# Patient Record
Sex: Female | Born: 1945
Health system: Southern US, Community
[De-identification: ages and names within clinical notes are randomized; demographics above are authoritative.]

## PROBLEM LIST (undated history)

## (undated) DIAGNOSIS — Z9889 Other specified postprocedural states: Secondary | ICD-10-CM

## (undated) DIAGNOSIS — R112 Nausea with vomiting, unspecified: Secondary | ICD-10-CM

## (undated) DIAGNOSIS — C801 Malignant (primary) neoplasm, unspecified: Secondary | ICD-10-CM

## (undated) HISTORY — PX: ABDOMINAL HYSTERECTOMY: SHX81

---

## 2007-05-17 ENCOUNTER — Ambulatory Visit (HOSPITAL_COMMUNITY): Admission: RE | Admit: 2007-05-17 | Discharge: 2007-05-17 | Payer: Self-pay | Admitting: Ophthalmology

## 2007-06-07 ENCOUNTER — Ambulatory Visit (HOSPITAL_COMMUNITY): Admission: RE | Admit: 2007-06-07 | Discharge: 2007-06-07 | Payer: Self-pay | Admitting: Ophthalmology

## 2012-01-19 ENCOUNTER — Emergency Department (HOSPITAL_COMMUNITY): Payer: Medicare Other

## 2012-01-19 ENCOUNTER — Observation Stay (HOSPITAL_COMMUNITY): Payer: Medicare Other | Admitting: Anesthesiology

## 2012-01-19 ENCOUNTER — Inpatient Hospital Stay (HOSPITAL_COMMUNITY)
Admission: EM | Admit: 2012-01-19 | Discharge: 2012-01-23 | DRG: 340 | Disposition: A | Discharge status: Home / Self Care | Payer: Medicare Other | Attending: General Surgery | Admitting: General Surgery

## 2012-01-19 ENCOUNTER — Encounter (HOSPITAL_COMMUNITY): Payer: Self-pay | Admitting: Emergency Medicine

## 2012-01-19 ENCOUNTER — Encounter (HOSPITAL_COMMUNITY): Payer: Self-pay | Admitting: Anesthesiology

## 2012-01-19 ENCOUNTER — Encounter (HOSPITAL_COMMUNITY)
Admission: EM | Disposition: A | Discharge status: Home / Self Care | Payer: Self-pay | Source: Home / Self Care | Attending: General Surgery

## 2012-01-19 DIAGNOSIS — K3580 Unspecified acute appendicitis: Secondary | ICD-10-CM

## 2012-01-19 DIAGNOSIS — K35209 Acute appendicitis with generalized peritonitis, without abscess, unspecified as to perforation: Principal | ICD-10-CM | POA: Diagnosis present

## 2012-01-19 DIAGNOSIS — F17200 Nicotine dependence, unspecified, uncomplicated: Secondary | ICD-10-CM | POA: Diagnosis present

## 2012-01-19 DIAGNOSIS — K352 Acute appendicitis with generalized peritonitis, without abscess: Principal | ICD-10-CM | POA: Diagnosis present

## 2012-01-19 HISTORY — DX: Nausea with vomiting, unspecified: R11.2

## 2012-01-19 HISTORY — PX: LAPAROSCOPIC APPENDECTOMY: SHX408

## 2012-01-19 HISTORY — DX: Other specified postprocedural states: Z98.890

## 2012-01-19 HISTORY — DX: Malignant (primary) neoplasm, unspecified: C80.1

## 2012-01-19 LAB — CBC WITH DIFFERENTIAL/PLATELET
Basophils Absolute: 0 10*3/uL (ref 0.0–0.1)
Basophils Relative: 0 % (ref 0–1)
Eosinophils Absolute: 0 10*3/uL (ref 0.0–0.7)
Hemoglobin: 15.1 g/dL — ABNORMAL HIGH (ref 12.0–15.0)
MCH: 31.7 pg (ref 26.0–34.0)
MCHC: 34.6 g/dL (ref 30.0–36.0)
Monocytes Relative: 3 % (ref 3–12)
Neutro Abs: 15 10*3/uL — ABNORMAL HIGH (ref 1.7–7.7)
Neutrophils Relative %: 91 % — ABNORMAL HIGH (ref 43–77)
RDW: 13.4 % (ref 11.5–15.5)

## 2012-01-19 LAB — HEPATIC FUNCTION PANEL
Alkaline Phosphatase: 79 U/L (ref 39–117)
Bilirubin, Direct: 0.2 mg/dL (ref 0.0–0.3)
Indirect Bilirubin: 0.6 mg/dL (ref 0.3–0.9)
Total Bilirubin: 0.8 mg/dL (ref 0.3–1.2)

## 2012-01-19 LAB — URINALYSIS, ROUTINE W REFLEX MICROSCOPIC
Bilirubin Urine: NEGATIVE
Ketones, ur: 15 mg/dL — AB
Leukocytes, UA: NEGATIVE
Nitrite: NEGATIVE
Urobilinogen, UA: 0.2 mg/dL (ref 0.0–1.0)

## 2012-01-19 LAB — URINE MICROSCOPIC-ADD ON

## 2012-01-19 LAB — BASIC METABOLIC PANEL
BUN: 20 mg/dL (ref 6–23)
Creatinine, Ser: 0.7 mg/dL (ref 0.50–1.10)
GFR calc Af Amer: >90 mL/min (ref >90)
GFR calc non Af Amer: 87 mL/min — ABNORMAL LOW (ref >90)
Potassium: 3.6 mEq/L (ref 3.5–5.1)

## 2012-01-19 SURGERY — APPENDECTOMY, LAPAROSCOPIC
Anesthesia: General | Wound class: Dirty or Infected

## 2012-01-19 MED ORDER — ROCURONIUM BROMIDE 100 MG/10ML IV SOLN
INTRAVENOUS | Status: DC | PRN
  Administered 2012-01-19: 30 mg via INTRAVENOUS

## 2012-01-19 MED ORDER — ENOXAPARIN SODIUM 40 MG/0.4ML ~~LOC~~ SOLN
40.0000 mg | SUBCUTANEOUS | Status: DC
  Administered 2012-01-19 – 2012-01-22 (×4): 40 mg via SUBCUTANEOUS
  Filled 2012-01-19 (×4): qty 0.4

## 2012-01-19 MED ORDER — SODIUM CHLORIDE 0.9 % IV SOLN
INTRAVENOUS | Status: DC
  Administered 2012-01-19 (×2): via INTRAVENOUS

## 2012-01-19 MED ORDER — MIDAZOLAM HCL 2 MG/2ML IJ SOLN
INTRAMUSCULAR | Status: AC
  Filled 2012-01-19: qty 2

## 2012-01-19 MED ORDER — LIDOCAINE HCL (PF) 1 % IJ SOLN
INTRAMUSCULAR | Status: AC
  Filled 2012-01-19: qty 5

## 2012-01-19 MED ORDER — SODIUM CHLORIDE 0.9 % IV SOLN
1.0000 g | INTRAVENOUS | Status: DC
  Administered 2012-01-20 – 2012-01-22 (×3): 1 g via INTRAVENOUS
  Filled 2012-01-19 (×4): qty 1

## 2012-01-19 MED ORDER — IOHEXOL 300 MG/ML  SOLN
100.0000 mL | Freq: Once | INTRAMUSCULAR | Status: AC | PRN
  Administered 2012-01-19: 100 mL via INTRAVENOUS

## 2012-01-19 MED ORDER — GLYCOPYRROLATE 0.2 MG/ML IJ SOLN
INTRAMUSCULAR | Status: AC
  Filled 2012-01-19: qty 2

## 2012-01-19 MED ORDER — PROPOFOL 10 MG/ML IV BOLUS
INTRAVENOUS | Status: DC | PRN
  Administered 2012-01-19: 130 mg via INTRAVENOUS

## 2012-01-19 MED ORDER — ONDANSETRON HCL 4 MG/2ML IJ SOLN
4.0000 mg | INTRAMUSCULAR | Status: DC | PRN
  Administered 2012-01-19: 4 mg via INTRAVENOUS
  Filled 2012-01-19: qty 2

## 2012-01-19 MED ORDER — LIDOCAINE HCL (CARDIAC) 10 MG/ML IV SOLN
INTRAVENOUS | Status: DC | PRN
  Administered 2012-01-19 (×2): 20 mg via INTRAVENOUS

## 2012-01-19 MED ORDER — SODIUM CHLORIDE 0.9 % IV SOLN
1.0000 g | Freq: Once | INTRAVENOUS | Status: AC
  Administered 2012-01-19: 1 g via INTRAVENOUS
  Filled 2012-01-19: qty 1

## 2012-01-19 MED ORDER — LACTATED RINGERS IV SOLN
INTRAVENOUS | Status: DC | PRN
  Administered 2012-01-19 (×2): via INTRAVENOUS

## 2012-01-19 MED ORDER — ROCURONIUM BROMIDE 50 MG/5ML IV SOLN
INTRAVENOUS | Status: AC
  Filled 2012-01-19: qty 1

## 2012-01-19 MED ORDER — ONDANSETRON HCL 4 MG/2ML IJ SOLN
INTRAMUSCULAR | Status: AC
  Administered 2012-01-19: 4 mg
  Filled 2012-01-19: qty 2

## 2012-01-19 MED ORDER — PROPOFOL 10 MG/ML IV EMUL
INTRAVENOUS | Status: AC
  Filled 2012-01-19: qty 20

## 2012-01-19 MED ORDER — FENTANYL CITRATE 0.05 MG/ML IJ SOLN
INTRAMUSCULAR | Status: AC
  Administered 2012-01-19: 50 ug
  Filled 2012-01-19: qty 2

## 2012-01-19 MED ORDER — MIDAZOLAM HCL 5 MG/5ML IJ SOLN
INTRAMUSCULAR | Status: DC | PRN
  Administered 2012-01-19: 2 mg via INTRAVENOUS

## 2012-01-19 MED ORDER — BUPIVACAINE HCL (PF) 0.5 % IJ SOLN
INTRAMUSCULAR | Status: DC | PRN
  Administered 2012-01-19: 10 mL

## 2012-01-19 MED ORDER — ONDANSETRON HCL 4 MG/2ML IJ SOLN
4.0000 mg | Freq: Four times a day (QID) | INTRAMUSCULAR | Status: DC | PRN
  Administered 2012-01-19 – 2012-01-20 (×2): 4 mg via INTRAVENOUS
  Filled 2012-01-19 (×2): qty 2

## 2012-01-19 MED ORDER — FENTANYL CITRATE 0.05 MG/ML IJ SOLN
INTRAMUSCULAR | Status: DC | PRN
  Administered 2012-01-19 (×3): 50 ug via INTRAVENOUS

## 2012-01-19 MED ORDER — FENTANYL CITRATE 0.05 MG/ML IJ SOLN
50.0000 ug | INTRAMUSCULAR | Status: DC | PRN
  Administered 2012-01-19: 50 ug via INTRAVENOUS
  Filled 2012-01-19: qty 2

## 2012-01-19 MED ORDER — FENTANYL CITRATE 0.05 MG/ML IJ SOLN
INTRAMUSCULAR | Status: AC
  Filled 2012-01-19: qty 5

## 2012-01-19 MED ORDER — SODIUM CHLORIDE 0.9 % IR SOLN
Status: DC | PRN
  Administered 2012-01-19: 1000 mL

## 2012-01-19 MED ORDER — ENOXAPARIN SODIUM 40 MG/0.4ML ~~LOC~~ SOLN
40.0000 mg | Freq: Once | SUBCUTANEOUS | Status: AC
  Administered 2012-01-19: 40 mg via SUBCUTANEOUS

## 2012-01-19 MED ORDER — PANTOPRAZOLE SODIUM 40 MG IV SOLR
40.0000 mg | Freq: Every day | INTRAVENOUS | Status: DC
  Administered 2012-01-19 – 2012-01-22 (×4): 40 mg via INTRAVENOUS
  Filled 2012-01-19 (×4): qty 40

## 2012-01-19 MED ORDER — SODIUM CHLORIDE 0.9 % IR SOLN
Status: DC | PRN
  Administered 2012-01-19: 3000 mL

## 2012-01-19 MED ORDER — HYDROCODONE-ACETAMINOPHEN 5-325 MG PO TABS: 1.0000 | ORAL_TABLET | ORAL | Status: DC | PRN

## 2012-01-19 SURGICAL SUPPLY — 47 items
BAG HAMPER (MISCELLANEOUS) ×2 IMPLANT
BENZOIN TINCTURE PRP APPL 2/3 (GAUZE/BANDAGES/DRESSINGS) IMPLANT
CLOTH BEACON ORANGE TIMEOUT ST (SAFETY) ×2 IMPLANT
COVER LIGHT HANDLE STERIS (MISCELLANEOUS) ×4 IMPLANT
CUTTER ENDO LINEAR 45M (STAPLE) ×2 IMPLANT
DECANTER SPIKE VIAL GLASS SM (MISCELLANEOUS) ×2 IMPLANT
DEVICE TROCAR PUNCTURE CLOSURE (ENDOMECHANICALS) ×2 IMPLANT
DURAPREP 26ML APPLICATOR (WOUND CARE) ×2 IMPLANT
ELECT REM PT RETURN 9FT ADLT (ELECTROSURGICAL) ×2
ELECTRODE REM PT RTRN 9FT ADLT (ELECTROSURGICAL) ×1 IMPLANT
FILTER SMOKE EVAC LAPAROSHD (FILTER) ×2 IMPLANT
FORMALIN 10 PREFIL 120ML (MISCELLANEOUS) ×2 IMPLANT
GLOVE BIOGEL PI IND STRL 7.0 (GLOVE) ×2 IMPLANT
GLOVE BIOGEL PI IND STRL 7.5 (GLOVE) ×1 IMPLANT
GLOVE BIOGEL PI INDICATOR 7.0 (GLOVE) ×2
GLOVE BIOGEL PI INDICATOR 7.5 (GLOVE) ×1
GLOVE ECLIPSE 7.0 STRL STRAW (GLOVE) ×2 IMPLANT
GLOVE EXAM NITRILE MD LF STRL (GLOVE) ×2 IMPLANT
GLOVE SS BIOGEL STRL SZ 6.5 (GLOVE) ×1 IMPLANT
GLOVE SUPERSENSE BIOGEL SZ 6.5 (GLOVE) ×1
GOWN STRL REIN XL XLG (GOWN DISPOSABLE) ×4 IMPLANT
INST SET LAPROSCOPIC AP (KITS) ×2 IMPLANT
IV NS IRRIG 3000ML ARTHROMATIC (IV SOLUTION) ×2 IMPLANT
KIT ROOM TURNOVER APOR (KITS) ×2 IMPLANT
MANIFOLD NEPTUNE II (INSTRUMENTS) ×2 IMPLANT
NEEDLE INSUFFLATION 14GA 120MM (NEEDLE) ×2 IMPLANT
NS IRRIG 1000ML POUR BTL (IV SOLUTION) ×2 IMPLANT
PACK LAP CHOLE LZT030E (CUSTOM PROCEDURE TRAY) ×2 IMPLANT
PAD ARMBOARD 7.5X6 YLW CONV (MISCELLANEOUS) ×2 IMPLANT
POUCH SPECIMEN RETRIEVAL 10MM (ENDOMECHANICALS) ×2 IMPLANT
RELOAD 45 VASCULAR/THIN (ENDOMECHANICALS) IMPLANT
RELOAD STAPLE TA45 3.5 REG BLU (ENDOMECHANICALS) ×2 IMPLANT
SEALER TISSUE G2 CVD JAW 35 (ENDOMECHANICALS) ×1 IMPLANT
SEALER TISSUE G2 CVD JAW 45CM (ENDOMECHANICALS) ×1
SET BASIN LINEN APH (SET/KITS/TRAYS/PACK) ×2 IMPLANT
SET TUBE IRRIG SUCTION NO TIP (IRRIGATION / IRRIGATOR) ×2 IMPLANT
SPONGE GAUZE 2X2 8PLY STRL LF (GAUZE/BANDAGES/DRESSINGS) ×6 IMPLANT
STAPLER VISISTAT (STAPLE) ×2 IMPLANT
STRIP CLOSURE SKIN 1/2X4 (GAUZE/BANDAGES/DRESSINGS) IMPLANT
SUT MNCRL AB 4-0 PS2 18 (SUTURE) ×2 IMPLANT
SUT VIC AB 2-0 CT2 27 (SUTURE) ×4 IMPLANT
TAPE CLOTH SURG 4X10 WHT LF (GAUZE/BANDAGES/DRESSINGS) ×2 IMPLANT
TRAY FOLEY CATH 14FR (SET/KITS/TRAYS/PACK) ×2 IMPLANT
TROCAR Z-THAD FIOS HNDL 12X100 (TROCAR) ×2 IMPLANT
TROCAR Z-THRD FIOS HNDL 11X100 (TROCAR) ×2 IMPLANT
TROCAR Z-THREAD FIOS 5X100MM (TROCAR) ×2 IMPLANT
WARMER LAPAROSCOPE (MISCELLANEOUS) ×2 IMPLANT

## 2012-01-19 NOTE — Addendum Note (Signed)
Addendum  created 01/19/12 2008 by Franco Nones, CRNA   Modules edited:Anesthesia Events, Anesthesia Medication Administration

## 2012-01-19 NOTE — Anesthesia Procedure Notes (Signed)
Procedure Name: Intubation Date/Time: 01/19/2012 6:43 PM Performed by: Franco Nones Pre-anesthesia Checklist: Patient identified, Patient being monitored, Timeout performed, Emergency Drugs available and Suction available Patient Re-evaluated:Patient Re-evaluated prior to inductionOxygen Delivery Method: Circle System Utilized Preoxygenation: Pre-oxygenation with 100% oxygen Intubation Type: IV induction, Rapid sequence and Cricoid Pressure applied Ventilation: Mask ventilation without difficulty Laryngoscope Size: Mac and 3 Grade View: Grade III Tube type: Oral Tube size: 7.0 mm Number of attempts: 2 Airway Equipment and Method: stylet Placement Confirmation: ETT inserted through vocal cords under direct vision,  positive ETCO2 and breath sounds checked- equal and bilateral Secured at: 21 cm Tube secured with: Tape Dental Injury: Teeth and Oropharynx as per pre-operative assessment  Comments: Unable to visualize with Hyacinth Meeker 2  Grade 3 with mac 3

## 2012-01-19 NOTE — Transfer of Care (Signed)
Immediate Anesthesia Transfer of Care Note  Patient: Candice Landry  Procedure(s) Performed: Procedure(s) (LRB): APPENDECTOMY LAPAROSCOPIC (N/A)  Patient Location: PACU  Anesthesia Type: General  Level of Consciousness: awake  Airway & Oxygen Therapy: Patient Spontanous Breathing and non-rebreather face mask  Post-op Assessment: Report given to PACU RN, Post -op Vital signs reviewed and stable and Patient moving all extremities  Post vital signs: Reviewed and stable  Complications: No apparent anesthesia complications

## 2012-01-19 NOTE — H&P (Signed)
Candice Landry is an 67 y.o. female.   Chief Complaint: Abdominal pain HPI: Patient presented to [facility] with approximately 2-3 days of increasing right lower quadrant abdominal pain. No similar symptomatology in the past. Pain is been continuous. No significant radiation. Pain is worse with palpation and movement. Pain is also worse with bumps during her trip to the hospital. She has had associated nausea but no emesis. She has had associated subjective fevers and chills. No change in bowel movements. No melena or hematochezia. No sick contacts. No unusual exposures.  History reviewed. No pertinent past medical history.  Past Surgical History  Procedure Date  . Abdominal hysterectomy     No family history on file. Social History:  reports that she has been smoking Cigarettes.  She has been smoking about .5 packs per day. She does not have any smokeless tobacco history on file. She reports that she drinks alcohol. Her drug history not on file.  Allergies: No Known Allergies   (Not in a hospital admission)  Results for orders placed during the hospital encounter of 01/19/12 (from the past 48 hour(s))  CBC WITH DIFFERENTIAL     Status: Abnormal   Collection Time   01/19/12 11:14 AM      Component Value Range Comment   WBC 16.4 (*) 4.0 - 10.5 K/uL    RBC 4.76  3.87 - 5.11 MIL/uL    Hemoglobin 15.1 (*) 12.0 - 15.0 g/dL    HCT 40.9  81.1 - 91.4 %    MCV 91.8  78.0 - 100.0 fL    MCH 31.7  26.0 - 34.0 pg    MCHC 34.6  30.0 - 36.0 g/dL    RDW 78.2  95.6 - 21.3 %    Platelets 226  150 - 400 K/uL    Neutrophils Relative 91 (*) 43 - 77 %    Neutro Abs 15.0 (*) 1.7 - 7.7 K/uL    Lymphocytes Relative 6 (*) 12 - 46 %    Lymphs Abs 0.9  0.7 - 4.0 K/uL    Monocytes Relative 3  3 - 12 %    Monocytes Absolute 0.5  0.1 - 1.0 K/uL    Eosinophils Relative 0  0 - 5 %    Eosinophils Absolute 0.0  0.0 - 0.7 K/uL    Basophils Relative 0  0 - 1 %    Basophils Absolute 0.0  0.0 - 0.1 K/uL     BASIC METABOLIC PANEL     Status: Abnormal   Collection Time   01/19/12 11:14 AM      Component Value Range Comment   Sodium 134 (*) 135 - 145 mEq/L    Potassium 3.6  3.5 - 5.1 mEq/L    Chloride 98  96 - 112 mEq/L    CO2 26  19 - 32 mEq/L    Glucose, Bld 141 (*) 70 - 99 mg/dL    BUN 20  6 - 23 mg/dL    Creatinine, Ser 0.86  0.50 - 1.10 mg/dL    Calcium 9.8  8.4 - 57.8 mg/dL    GFR calc non Af Amer 87 (*) >90 mL/min    GFR calc Af Amer >90  >90 mL/min   LIPASE, BLOOD     Status: Normal   Collection Time   01/19/12 11:14 AM      Component Value Range Comment   Lipase 15  11 - 59 U/L   HEPATIC FUNCTION PANEL  Status: Normal   Collection Time   01/19/12 11:14 AM      Component Value Range Comment   Total Protein 7.7  6.0 - 8.3 g/dL    Albumin 4.0  3.5 - 5.2 g/dL    AST 12  0 - 37 U/L    ALT 18  0 - 35 U/L    Alkaline Phosphatase 79  39 - 117 U/L    Total Bilirubin 0.8  0.3 - 1.2 mg/dL    Bilirubin, Direct 0.2  0.0 - 0.3 mg/dL    Indirect Bilirubin 0.6  0.3 - 0.9 mg/dL   URINALYSIS, ROUTINE W REFLEX MICROSCOPIC     Status: Abnormal   Collection Time   01/19/12 11:50 AM      Component Value Range Comment   Color, Urine YELLOW  YELLOW    APPearance CLEAR  CLEAR    Specific Gravity, Urine >1.030 (*) 1.005 - 1.030    pH 5.5  5.0 - 8.0    Glucose, UA NEGATIVE  NEGATIVE mg/dL    Hgb urine dipstick SMALL (*) NEGATIVE    Bilirubin Urine NEGATIVE  NEGATIVE    Ketones, ur 15 (*) NEGATIVE mg/dL    Protein, ur TRACE (*) NEGATIVE mg/dL    Urobilinogen, UA 0.2  0.0 - 1.0 mg/dL    Nitrite NEGATIVE  NEGATIVE    Leukocytes, UA NEGATIVE  NEGATIVE   URINE MICROSCOPIC-ADD ON     Status: Abnormal   Collection Time   01/19/12 11:50 AM      Component Value Range Comment   Squamous Epithelial / LPF MANY (*) RARE    WBC, UA 7-10  <3 WBC/hpf    RBC / HPF 7-10  <3 RBC/hpf    Bacteria, UA MANY (*) RARE    Dg Chest 2 View  01/19/2012  *RADIOLOGY REPORT*  Clinical Data: Abdominal pain  CHEST - 2 VIEW   Comparison: None.  Findings: Lungs are hyperaerated. Bronchitic and interstitial changes have a chronic appearance.  Upper normal heart size.  Round density projects over the anterior right first rib.  Focal density projects over the anterior mid thoracic spine.  No pneumothorax. No pleural effusion.  IMPRESSION: There are densities projecting over the right first rib and thoracic spine which may represent bony findings.  Pulmonary nodules are not excluded.  Comparison with prior studies are recommended.  If none are available, CT can be performed to rule out nodule.   Original Report Authenticated By: Jolaine Click, M.D.    Ct Abdomen Pelvis W Contrast  01/19/2012  **ADDENDUM** CREATED: 01/19/2012 13:39:01  Addendum by Dr. Christiana Pellant on 01/19/2012 at 1:39 p.m. Terminal ileitis with secondary inflammation of the appendix, such as may be seen with Crohn's disease or other infection/inflammatory etiology, is possible but less likely.  **END ADDENDUM** SIGNED BY: Harrel Lemon, M.D.   01/19/2012  *RADIOLOGY REPORT*  Clinical Data: Abdominal pain.  Previous hysterectomy.  CT ABDOMEN AND PELVIS WITH CONTRAST  Technique:  Multidetector CT imaging of the abdomen and pelvis was performed following the standard protocol during bolus administration of intravenous contrast.  Contrast: OMNIPAQUE IOHEXOL 300 MG/ML  SOLN  Comparison: None  Findings: Minimal subpleural dependent scarring or atelectasis. Too small to characterize right hepatic lobe 6 mm hypodensity image 15, statistically most likely a cyst; other too small to characterize hepatic hypodense lesions are identified elsewhere. 1.7 cm right hepatic lobe cyst image 22.  4.8 cm right inferior renal pole cortical cyst.  Other too  small to characterize renal cortical hypodensities are identified elsewhere.  1.2 cm right upper renal pole cortical cyst image 21.  Small hiatal hernia.  The adrenal glands are diffusely prominent bilaterally without focal  measurable mass.  Gallbladder, pancreas, and spleen are unremarkable.  Mild moderate atheromatous aortic calcification without aneurysm.  No lymphadenopathy.  Within the pelvis, the appendix is dilated and fluid-filled, measuring maximal diameter 1.2 cm at its tip, with a proximal probable appendicolith image 65.  Surrounding pelvic stranding, pelvic free fluid with rim enhancement, and secondary terminal ileal small bowel wall thickening and mild prominence.  The colon is decompressed.  No acute osseous abnormality.  IMPRESSION: Dilated appendix with probable appendicolith and periappendiceal stranding and fluid, compatible with acute appendicitis.  Rim enhancing fluid collections within the pelvis may indicate early abscess formation, and can also be seen with appendiceal rupture.  Apparent secondary terminal ileal wall thickening and prominent, likely due to secondary inflammation.   Original Report Authenticated By: Christiana Pellant, M.D.     Review of Systems  Constitutional: Positive for fever, chills and diaphoresis. Negative for weight loss and malaise/fatigue.  HENT: Negative.   Eyes: Negative.   Respiratory: Negative.   Cardiovascular: Negative.   Gastrointestinal: Positive for nausea and abdominal pain (right lower quadrant). Negative for heartburn, vomiting, diarrhea, constipation, blood in stool and melena.  Genitourinary: Negative.   Musculoskeletal: Negative.   Skin: Negative.   Neurological: Negative.  Negative for weakness.  Endo/Heme/Allergies: Negative.   Psychiatric/Behavioral: Negative.     Blood pressure 122/64, pulse 90, temperature 99.1 F (37.3 C), resp. rate 18, height 5\' 4"  (1.626 m), weight 56.7 kg (125 lb), SpO2 95.00%. Physical Exam  Constitutional: She is oriented to person, place, and time. She appears well-developed and well-nourished. No distress.  HENT:  Head: Normocephalic and atraumatic.  Eyes: Conjunctivae normal and EOM are normal. Pupils are equal, round,  and reactive to light. No scleral icterus.  Neck: Normal range of motion. Neck supple. No tracheal deviation present. No thyromegaly present.  Cardiovascular: Normal rate, regular rhythm and normal heart sounds.   Respiratory: Effort normal and breath sounds normal. No respiratory distress. She has no wheezes.  GI: Soft. She exhibits no distension and no mass. There is tenderness (right lower quadrant pain at eburneous point. Positive Rovsing sign.). There is rebound and guarding.  Lymphadenopathy:    She has no cervical adenopathy.  Neurological: She is alert and oriented to person, place, and time.  Skin: Skin is warm and dry.     Assessment/Plan Acute appendicitis. I did discuss the CT findings with the patient. Clinically the patient truly does have acute appendicitis however based on the CT scan I am somewhat suspicious of an early perforation as well. Definitive findings cannot be confirmed until proceeding to the operating room and this was discussed with the patient. Risks benefits alternatives of laparoscopic, possible open appendectomy were discussed. At this point we'll continue n.p.o. status. Continue IV fluid hydration. Continue DVT prophylaxis. We'll proceed to the operator for planned laparoscopic possible open appendectomy.  Hibba Schram C 01/19/2012, 3:34 PM

## 2012-01-19 NOTE — Anesthesia Preprocedure Evaluation (Addendum)
Anesthesia Evaluation  Patient identified by MRN, date of birth, ID band Patient awake    Reviewed: Allergy & Precautions, H&P , NPO status , Patient's Chart, lab work & pertinent test results, reviewed documented beta blocker date and time   History of Anesthesia Complications (+) PONV  Airway Mallampati: II TM Distance: >3 FB Neck ROM: Full    Dental  (+) Teeth Intact   Pulmonary Current Smoker,    Pulmonary exam normal       Cardiovascular Exercise Tolerance: Good Rhythm:Regular Rate:Normal     Neuro/Psych    GI/Hepatic Patient received Oral Contrast Agents,  Endo/Other    Renal/GU      Musculoskeletal   Abdominal (+)  Abdomen: tender.    Peds  Hematology   Anesthesia Other Findings   Reproductive/Obstetrics                          Anesthesia Physical Anesthesia Plan  ASA: II and emergent  Anesthesia Plan: General   Post-op Pain Management:    Induction: Rapid sequence, Cricoid pressure planned and Intravenous  Airway Management Planned: Oral ETT  Additional Equipment:   Intra-op Plan:   Post-operative Plan: Extubation in OR  Informed Consent:   Dental advisory given  Plan Discussed with: Anesthesiologist  Anesthesia Plan Comments:         Anesthesia Quick Evaluation

## 2012-01-19 NOTE — Op Note (Signed)
Patient:  Candice Landry  DOB:  November 30, 1945  MRN:  [id number]   Preop Diagnosis:  Acute appendicitis  Postop Diagnosis:  Ruptured appendicitis  Procedure:  Laparoscopic appendectomy  Surgeon:  Dr. Tilford Pillar  Anes:  General endotracheal, 0.5% Sensorcaine plain for local  Indications:  Patient is a 67 year old female presented to [facility] with right lower quadrant abdominal pain. Workup and evaluation was consistent for acute appendicitis. There was some suspicion is periappendiceal fluid in the suspicion of a contained rupture. Risks benefits alternatives a laparoscopic possible open appendectomy were discussed at length the patient including but not limited to risk of bleeding, infection, bowel injury, appendiceal stump leak. Patient consented for the planned procedure.  Procedure note:  Patient was taken to the operating room was placed in supine position the or table time the general anesthetic is administered. Once patient was asleep she symmetrically admitted by the nurse anesthetist. At this point a Foley catheter is placed in standard sterile fashion by the operative staff. Her abdomen is prepped with DuraPrep solution. Drapes are placed in standard fashion. I mask performed. Stab incision was created supraumbilically with 11 blade scalpel with additional dissection down to subcuticular tissue carried out with a Coker clamp. A Veress needle is inserted saline drop test is utilized confirm intraperitoneal placement. Pneumoperitoneum was initiated and once sufficient pneumoperitoneum was obtained a 12 mm trocar was inserted over a laparoscope allowing visualization the trocar entering into the peritoneal cavity. At this point the inner cannulas removed lap scope was reinserted there is no evidence a trocar peritoneal placement injury. At this point a 5 mm placed in the suprapubic region, and a 11 mm or was placed into the left lateral abdominal wall. Patient's placed into a   Trendelenburg left lateral decubitus position.  The cecum was followed down to the base the appendix. There is. Once noted around the appendix. Suction irrigator is brought to the field and utilized to aspirate the purulent secretions.  A window was in created the base the appendix between the appendix and mesoappendix. The Enseal bipolar device is utilized to divide the mesoappendix. Hemostasis is excellent. The base of the appendix is then divided using a Endo GIA 45 standard stapler load. At this point the appendix is placed into an Endo Catch bag and placed into the right upper quadrant. The patient was returned back to a supine position. The skin is copiously irrigated with sterile saline until the returning aspirate was clear. Inspection of the staple line indicated excellent closure. There is no evidence of any bleeding from the mesoappendix. At this time I turned my attention to closure.  Using an Endo Close suture passing device a 2-0 Vicryl sutures passed both the umbilical and left lateral trocar sites. With the sutures in place the appendix was removed through the umbilical trocar site and intact Endo Catch bag was placed in the back table and sent as a permanent specimen to pathology. At this point the pneumoperitoneum was evacuated. Trochars were removed. The Vicryl sutures were secured. Local anesthetic is instilled. Skin staples are utilized reapproximate the skin edges. The skin was washed dried moist dry towel. Dressings are placed over the incisions. The drapes removed the dressings were secured. The patient's allowed to come out of general anesthetic is extubated and transferred to the PACU in stable condition. At the conclusion of procedure all instrument, sponge, needle counts are correct. Patient tolerated procedure extremely well.  Complications:  None apparent  EBL:  Minimal  Specimen:  Appendix

## 2012-01-19 NOTE — Anesthesia Postprocedure Evaluation (Signed)
Anesthesia Post Note  Patient: Candice Landry  Procedure(s) Performed: Procedure(s) (LRB): APPENDECTOMY LAPAROSCOPIC (N/A)  Anesthesia type: General  Patient location: PACU  Post pain: Pain level controlled  Post assessment: Post-op Vital signs reviewed, Patient's Cardiovascular Status Stable, Respiratory Function Stable, Patent Airway, No signs of Nausea or vomiting and Pain level controlled  Last Vitals:  Filed Vitals:   01/19/12 1941  BP: 128/63  Pulse: 96  Temp: 37.1 C  Resp: 24    Post vital signs: Reviewed and stable  Level of consciousness: awake and alert   Complications: No apparent anesthesia complications

## 2012-01-19 NOTE — ED Notes (Signed)
Pt states severe abd pain with some vomiting and hurts to move. Denies diarrhea. Last BM was this morning and was normal

## 2012-01-19 NOTE — ED Provider Notes (Signed)
History     CSN: [id number]  Arrival date & time 01/19/12  1014   First MD Initiated Contact with Patient 01/19/12 1059      Chief Complaint  Patient presents with  . Abdominal Pain  . Emesis     HPI Pt was seen at 1200.   Per pt, c/o gradual onset and worsening of constant generalized abd "pain" for the past 2 days.  Has been associated with several episodes of N/V.  States her pain is worse in her lower abd/pelvic area. Pain increases with movement of her torso, walking, and palpation of the area.  Denies diarrhea, no fevers, no back pain, no CP/SOB, no black or blood in stools or emesis, no dysuria, no vaginal bleeding/discharge.     History reviewed. No pertinent past medical history.  Past Surgical History  Procedure Date  . Abdominal hysterectomy     History  Substance Use Topics  . Smoking status: Current Every Day Smoker -- 0.5 packs/day    Types: Cigarettes  . Smokeless tobacco: Not on file  . Alcohol Use: Yes     Comment: occ    Review of Systems ROS: Statement: All systems negative except as marked or noted in the HPI; Constitutional: Negative for fever and chills. ; ; Eyes: Negative for eye pain, redness and discharge. ; ; ENMT: Negative for ear pain, hoarseness, nasal congestion, sinus pressure and sore throat. ; ; Cardiovascular: Negative for chest pain, palpitations, diaphoresis, dyspnea and peripheral edema. ; ; Respiratory: Negative for cough, wheezing and stridor. ; ; Gastrointestinal: +N/V, abd pain. Negative for diarrhea, blood in stool, hematemesis, jaundice and rectal bleeding. . ; ; Genitourinary: Negative for dysuria, flank pain and hematuria. ; ; Musculoskeletal: Negative for back pain and neck pain. Negative for swelling and trauma.; ; Skin: Negative for pruritus, rash, abrasions, blisters, bruising and skin lesion.; ; Neuro: Negative for headache, lightheadedness and neck stiffness. Negative for weakness, altered level of consciousness , altered mental  status, extremity weakness, paresthesias, involuntary movement, seizure and syncope.     Allergies  Review of patient's allergies indicates no known allergies.  Home Medications  No current outpatient prescriptions on file.  BP 122/64  Pulse 90  Temp 99.1 F (37.3 C)  Resp 18  Ht 5\' 4"  (1.626 m)  Wt 125 lb (56.7 kg)  BMI 21.46 kg/m2  SpO2 95%  Physical Exam 1205: Physical examination:  Nursing notes reviewed; Vital signs and O2 SAT reviewed;  Constitutional: Well developed, Well nourished, Well hydrated, Uncomfortable appearing; Head:  Normocephalic, atraumatic; Eyes: EOMI, PERRL, No scleral icterus; ENMT: Mouth and pharynx normal, Mucous membranes moist; Neck: Supple, Full range of motion, No lymphadenopathy; Cardiovascular: Regular rate and rhythm, No gallop; Respiratory: Breath sounds clear & equal bilaterally, No rales, rhonchi, wheezes.  Speaking full sentences with ease, Normal respiratory effort/excursion; Chest: Nontender, Movement normal; Abdomen: Soft, +diffusely tender, esp RLQ and suprapubic areas. Nondistended, Normal bowel sounds; Genitourinary: No CVA tenderness; Extremities: Pulses normal, No tenderness, No edema, No calf edema or asymmetry.; Neuro: AA&Ox3, Major CN grossly intact.  Speech clear. No gross focal motor or sensory deficits in extremities.; Skin: Color normal, Warm, Dry.    ED Course  Procedures     MDM  MDM Reviewed: nursing note and vitals Interpretation: labs, CT scan and x-ray     Results for orders placed during the hospital encounter of 01/19/12  CBC WITH DIFFERENTIAL      Component Value Range   WBC 16.4 (*) 4.0 -  10.5 K/uL   RBC 4.76  3.87 - 5.11 MIL/uL   Hemoglobin 15.1 (*) 12.0 - 15.0 g/dL   HCT 27.2  53.6 - 64.4 %   MCV 91.8  78.0 - 100.0 fL   MCH 31.7  26.0 - 34.0 pg   MCHC 34.6  30.0 - 36.0 g/dL   RDW 03.4  74.2 - 59.5 %   Platelets 226  150 - 400 K/uL   Neutrophils Relative 91 (*) 43 - 77 %   Neutro Abs 15.0 (*) 1.7 - 7.7  K/uL   Lymphocytes Relative 6 (*) 12 - 46 %   Lymphs Abs 0.9  0.7 - 4.0 K/uL   Monocytes Relative 3  3 - 12 %   Monocytes Absolute 0.5  0.1 - 1.0 K/uL   Eosinophils Relative 0  0 - 5 %   Eosinophils Absolute 0.0  0.0 - 0.7 K/uL   Basophils Relative 0  0 - 1 %   Basophils Absolute 0.0  0.0 - 0.1 K/uL  BASIC METABOLIC PANEL      Component Value Range   Sodium 134 (*) 135 - 145 mEq/L   Potassium 3.6  3.5 - 5.1 mEq/L   Chloride 98  96 - 112 mEq/L   CO2 26  19 - 32 mEq/L   Glucose, Bld 141 (*) 70 - 99 mg/dL   BUN 20  6 - 23 mg/dL   Creatinine, Ser 6.38  0.50 - 1.10 mg/dL   Calcium 9.8  8.4 - 75.6 mg/dL   GFR calc non Af Amer 87 (*) >90 mL/min   GFR calc Af Amer >90  >90 mL/min  URINALYSIS, ROUTINE W REFLEX MICROSCOPIC      Component Value Range   Color, Urine YELLOW  YELLOW   APPearance CLEAR  CLEAR   Specific Gravity, Urine >1.030 (*) 1.005 - 1.030   pH 5.5  5.0 - 8.0   Glucose, UA NEGATIVE  NEGATIVE mg/dL   Hgb urine dipstick SMALL (*) NEGATIVE   Bilirubin Urine NEGATIVE  NEGATIVE   Ketones, ur 15 (*) NEGATIVE mg/dL   Protein, ur TRACE (*) NEGATIVE mg/dL   Urobilinogen, UA 0.2  0.0 - 1.0 mg/dL   Nitrite NEGATIVE  NEGATIVE   Leukocytes, UA NEGATIVE  NEGATIVE  LIPASE, BLOOD      Component Value Range   Lipase 15  11 - 59 U/L  HEPATIC FUNCTION PANEL      Component Value Range   Total Protein 7.7  6.0 - 8.3 g/dL   Albumin 4.0  3.5 - 5.2 g/dL   AST 12  0 - 37 U/L   ALT 18  0 - 35 U/L   Alkaline Phosphatase 79  39 - 117 U/L   Total Bilirubin 0.8  0.3 - 1.2 mg/dL   Bilirubin, Direct 0.2  0.0 - 0.3 mg/dL   Indirect Bilirubin 0.6  0.3 - 0.9 mg/dL  URINE MICROSCOPIC-ADD ON      Component Value Range   Squamous Epithelial / LPF MANY (*) RARE   WBC, UA 7-10  <3 WBC/hpf   RBC / HPF 7-10  <3 RBC/hpf   Bacteria, UA MANY (*) RARE   Dg Chest 2 View 01/19/2012  *RADIOLOGY REPORT*  Clinical Data: Abdominal pain  CHEST - 2 VIEW  Comparison: None.  Findings: Lungs are hyperaerated.  Bronchitic and interstitial changes have a chronic appearance.  Upper normal heart size.  Round density projects over the anterior right first rib.  Focal density projects over  the anterior mid thoracic spine.  No pneumothorax. No pleural effusion.  IMPRESSION: There are densities projecting over the right first rib and thoracic spine which may represent bony findings.  Pulmonary nodules are not excluded.  Comparison with prior studies are recommended.  If none are available, CT can be performed to rule out nodule.   Original Report Authenticated By: Jolaine Click, M.D.    Ct Abdomen Pelvis W Contrast 01/19/2012  **ADDENDUM** CREATED: 01/19/2012 13:39:01  Addendum by Dr. Christiana Pellant on 01/19/2012 at 1:39 p.m. Terminal ileitis with secondary inflammation of the appendix, such as may be seen with Crohn's disease or other infection/inflammatory etiology, is possible but less likely.  **END ADDENDUM** SIGNED BY: Harrel Lemon, M.D.   01/19/2012  *RADIOLOGY REPORT*  Clinical Data: Abdominal pain.  Previous hysterectomy.  CT ABDOMEN AND PELVIS WITH CONTRAST  Technique:  Multidetector CT imaging of the abdomen and pelvis was performed following the standard protocol during bolus administration of intravenous contrast.  Contrast: OMNIPAQUE IOHEXOL 300 MG/ML  SOLN  Comparison: None  Findings: Minimal subpleural dependent scarring or atelectasis. Too small to characterize right hepatic lobe 6 mm hypodensity image 15, statistically most likely a cyst; other too small to characterize hepatic hypodense lesions are identified elsewhere. 1.7 cm right hepatic lobe cyst image 22.  4.8 cm right inferior renal pole cortical cyst.  Other too small to characterize renal cortical hypodensities are identified elsewhere.  1.2 cm right upper renal pole cortical cyst image 21.  Small hiatal hernia.  The adrenal glands are diffusely prominent bilaterally without focal measurable mass.  Gallbladder, pancreas, and spleen are  unremarkable.  Mild moderate atheromatous aortic calcification without aneurysm.  No lymphadenopathy.  Within the pelvis, the appendix is dilated and fluid-filled, measuring maximal diameter 1.2 cm at its tip, with a proximal probable appendicolith image 65.  Surrounding pelvic stranding, pelvic free fluid with rim enhancement, and secondary terminal ileal small bowel wall thickening and mild prominence.  The colon is decompressed.  No acute osseous abnormality.  IMPRESSION: Dilated appendix with probable appendicolith and periappendiceal stranding and fluid, compatible with acute appendicitis.  Rim enhancing fluid collections within the pelvis may indicate early abscess formation, and can also be seen with appendiceal rupture.  Apparent secondary terminal ileal wall thickening and prominent, likely due to secondary inflammation.   Original Report Authenticated By: Christiana Pellant, M.D.      1415:  +acute appendicitis on CT scan, will keep NPO and start abx.  Dx and testing d/w pt and family.  Questions answered.  Verb understanding, agreeable to admit.  T/C to General Surgeon Dr. Leticia Penna, case discussed, including:  HPI, pertinent PM/SHx, VS/PE, dx testing, ED course and treatment:  Agreeable to admit, he will come to ED for eval.         Laray Anger, DO 01/20/12 2006

## 2012-01-20 ENCOUNTER — Encounter (HOSPITAL_COMMUNITY): Payer: Self-pay | Admitting: *Deleted

## 2012-01-20 LAB — CBC
MCHC: 34.2 g/dL (ref 30.0–36.0)
MCV: 91.2 fL (ref 78.0–100.0)
Platelets: 197 10*3/uL (ref 150–400)
RDW: 13.5 % (ref 11.5–15.5)
WBC: 14.3 10*3/uL — ABNORMAL HIGH (ref 4.0–10.5)

## 2012-01-20 LAB — URINE CULTURE

## 2012-01-20 MED ORDER — PHENOL 1.4 % MT LIQD
1.0000 | OROMUCOSAL | Status: DC | PRN
  Administered 2012-01-20: 1 via OROMUCOSAL
  Filled 2012-01-20: qty 177

## 2012-01-20 MED ORDER — SODIUM CHLORIDE 0.9 % IJ SOLN
3.0000 mL | Freq: Two times a day (BID) | INTRAMUSCULAR | Status: DC
  Administered 2012-01-20 – 2012-01-22 (×5): 3 mL via INTRAVENOUS

## 2012-01-20 MED ORDER — SODIUM CHLORIDE 0.9 % IJ SOLN
3.0000 mL | INTRAMUSCULAR | Status: DC | PRN
  Administered 2012-01-20 – 2012-01-22 (×2): 3 mL via INTRAVENOUS

## 2012-01-20 NOTE — Addendum Note (Signed)
Addendum  created 01/20/12 1118 by Marolyn Hammock, CRNA   Modules edited:Notes Section

## 2012-01-20 NOTE — Anesthesia Postprocedure Evaluation (Signed)
  Anesthesia Post-op Note  Patient: Candice Landry  Procedure(s) Performed: Procedure(s) (LRB) with comments: APPENDECTOMY LAPAROSCOPIC (N/A)  Patient Location: PACU  Anesthesia Type: General  Level of Consciousness: awake, alert , oriented and patient cooperative  Airway and Oxygen Therapy: Patient Spontanous Breathing   Post-op Pain: mild  Post-op Assessment: Post-op Vital signs reviewed, Patient's Cardiovascular Status Stable, Respiratory Function Stable, Patent Airway and No signs of Nausea or vomiting  Post-op Vital Signs: Reviewed and stable  Complications: No apparent anesthesia complications

## 2012-01-20 NOTE — Progress Notes (Signed)
UR chart review completed.  

## 2012-01-20 NOTE — Progress Notes (Signed)
1 Day Post-Op  Subjective: No fevers or chills. Sore especially on the left side of her abdomen otherwise no significant complaints. No nausea or vomiting.  Objective: Vital signs in last 24 hours: Temp:  [97.5 F (36.4 C)-99.1 F (37.3 C)] 98.1 F (36.7 C) (01/10 1027) Pulse Rate:  [85-112] 85  (01/10 1027) Resp:  [17-24] 20  (01/10 1027) BP: (98-128)/(57-68) 98/58 mmHg (01/10 1027) SpO2:  [94 %-100 %] 98 % (01/10 1027) Weight:  [65.6 kg (144 lb 10 oz)] 65.6 kg (144 lb 10 oz) (01/09 2032) Last BM Date: 01/19/12  Intake/Output from previous day: 01/09 0701 - 01/10 0700 In: 3142.5 [P.O.:420; I.V.:2712.5; IV Piggyback:10] Out: 1060 [Urine:1050; Blood:10] Intake/Output this shift: Total I/O In: 300 [P.O.:300] Out: -   General appearance: alert and no distress GI: Soft, expected postoperative tenderness. Incisions are clean dry and intact. No peritoneal signs.  Lab Results:   Basename 01/20/12 0551 01/19/12 1114  WBC 14.3* 16.4*  HGB 12.7 15.1*  HCT 37.1 43.7  PLT 197 226   BMET  Basename 01/19/12 1114  NA 134*  K 3.6  CL 98  CO2 26  GLUCOSE 141*  BUN 20  CREATININE 0.70  CALCIUM 9.8   PT/INR No results found for this basename: LABPROT:2,INR:2 in the last 72 hours ABG No results found for this basename: PHART:2,PCO2:2,PO2:2,HCO3:2 in the last 72 hours  Studies/Results: Dg Chest 2 View  01/19/2012  *RADIOLOGY REPORT*  Clinical Data: Abdominal pain  CHEST - 2 VIEW  Comparison: None.  Findings: Lungs are hyperaerated. Bronchitic and interstitial changes have a chronic appearance.  Upper normal heart size.  Round density projects over the anterior right first rib.  Focal density projects over the anterior mid thoracic spine.  No pneumothorax. No pleural effusion.  IMPRESSION: There are densities projecting over the right first rib and thoracic spine which may represent bony findings.  Pulmonary nodules are not excluded.  Comparison with prior studies are recommended.   If none are available, CT can be performed to rule out nodule.   Original Report Authenticated By: Jolaine Click, M.D.    Ct Abdomen Pelvis W Contrast  01/19/2012  **ADDENDUM** CREATED: 01/19/2012 13:39:01  Addendum by Dr. Christiana Pellant on 01/19/2012 at 1:39 p.m. Terminal ileitis with secondary inflammation of the appendix, such as may be seen with Crohn's disease or other infection/inflammatory etiology, is possible but less likely.  **END ADDENDUM** SIGNED BY: Harrel Lemon, M.D.   01/19/2012  *RADIOLOGY REPORT*  Clinical Data: Abdominal pain.  Previous hysterectomy.  CT ABDOMEN AND PELVIS WITH CONTRAST  Technique:  Multidetector CT imaging of the abdomen and pelvis was performed following the standard protocol during bolus administration of intravenous contrast.  Contrast: OMNIPAQUE IOHEXOL 300 MG/ML  SOLN  Comparison: None  Findings: Minimal subpleural dependent scarring or atelectasis. Too small to characterize right hepatic lobe 6 mm hypodensity image 15, statistically most likely a cyst; other too small to characterize hepatic hypodense lesions are identified elsewhere. 1.7 cm right hepatic lobe cyst image 22.  4.8 cm right inferior renal pole cortical cyst.  Other too small to characterize renal cortical hypodensities are identified elsewhere.  1.2 cm right upper renal pole cortical cyst image 21.  Small hiatal hernia.  The adrenal glands are diffusely prominent bilaterally without focal measurable mass.  Gallbladder, pancreas, and spleen are unremarkable.  Mild moderate atheromatous aortic calcification without aneurysm.  No lymphadenopathy.  Within the pelvis, the appendix is dilated and fluid-filled, measuring maximal diameter 1.2 cm  at its tip, with a proximal probable appendicolith image 65.  Surrounding pelvic stranding, pelvic free fluid with rim enhancement, and secondary terminal ileal small bowel wall thickening and mild prominence.  The colon is decompressed.  No acute osseous  abnormality.  IMPRESSION: Dilated appendix with probable appendicolith and periappendiceal stranding and fluid, compatible with acute appendicitis.  Rim enhancing fluid collections within the pelvis may indicate early abscess formation, and can also be seen with appendiceal rupture.  Apparent secondary terminal ileal wall thickening and prominent, likely due to secondary inflammation.   Original Report Authenticated By: Christiana Pellant, M.D.     Anti-infectives: Anti-infectives     Start     Dose/Rate Route Frequency Ordered Stop   01/20/12 1500   ertapenem (INVANZ) 1 g in sodium chloride 0.9 % 50 mL IVPB        1 g 100 mL/hr over 30 Minutes Intravenous Every 24 hours 01/19/12 2042     01/19/12 1445   ertapenem (INVANZ) 1 g in sodium chloride 0.9 % 50 mL IVPB        1 g 100 mL/hr over 30 Minutes Intravenous  Once 01/19/12 1441 01/19/12 1523          Assessment/Plan: s/p Procedure(s) (LRB) with comments: APPENDECTOMY LAPAROSCOPIC (N/A) Appendicitis. Continue IV antibiotics for 3 days. Continue to monitor her WBC count. Increase diet as tolerated. Continue ambulation. Hep-Lock IV.  LOS: 1 day    Nazim Kadlec C 01/20/2012

## 2012-01-20 NOTE — Care Management Note (Signed)
    Page 1 of 1   01/23/2012     11:56:25 AM   CARE MANAGEMENT NOTE 01/23/2012  Patient:  Candice Landry, Candice Landry   Account Number:  [id number]  Date Initiated:  01/20/2012  Documentation initiated by:  Sharrie Rothman  Subjective/Objective Assessment:   Pt admitted from home with acute appendicitis. Pt lives with her husband and will return home at discharge. Pt is independent with ADL's.     Action/Plan:   No CM or HH needs noted.   Anticipated DC Date:  01/22/2012   Anticipated DC Plan:  HOME/SELF CARE      DC Planning Services  CM consult      Choice offered to / List presented to:             Status of service:  Completed, signed off Medicare Important Message given?  YES (If response is "NO", the following Medicare IM given date fields will be blank) Date Medicare IM given:  01/23/2012 Date Additional Medicare IM given:    Discharge Disposition:  HOME/SELF CARE  Per UR Regulation:    If discussed at Long Length of Stay Meetings, dates discussed:    Comments:  01/23/12 1155 Arlyss Queen, RN BSN CM Pt discharged home today. No CM or HH needs noted.  01/20/12 1122 Arlyss Queen, RN BSN CM

## 2012-01-21 LAB — CBC
HCT: 35.8 % — ABNORMAL LOW (ref 36.0–46.0)
MCV: 90.9 fL (ref 78.0–100.0)
RDW: 13.5 % (ref 11.5–15.5)
WBC: 13.7 10*3/uL — ABNORMAL HIGH (ref 4.0–10.5)

## 2012-01-21 MED ORDER — SODIUM CHLORIDE 0.9 % IV SOLN
INTRAVENOUS | Status: AC
  Filled 2012-01-21: qty 1

## 2012-01-21 NOTE — Progress Notes (Signed)
Patient ambulating in halls with family often. Will continue to encourage.

## 2012-01-21 NOTE — Progress Notes (Signed)
2 Days Post-Op  Subjective: Still with some nausea. Some subjective chills. Tolerating some liquids. Abdominal pain is moderately controlled. No worsening symptomatology.  Objective: Vital signs in last 24 hours: Temp:  [97.8 F (36.6 Landry)-98.8 F (37.1 Landry)] 97.8 F (36.6 Landry) (01/11 1303) Pulse Rate:  [83-101] 93  (01/11 1303) Resp:  [17-20] 18  (01/11 1303) BP: (97-115)/(57-65) 115/57 mmHg (01/11 1303) SpO2:  [96 %-98 %] 97 % (01/11 1303) Last BM Date: 01/20/12  Intake/Output from previous day: 01/10 0701 - 01/11 0700 In: 1543.8 [P.O.:1040; I.V.:503.8] Out: -  Intake/Output this shift: Total I/O In: 720 [P.O.:720] Out: -   General appearance: alert and no distress GI: Intermittent bowel sounds, soft, mild distention. Mild to moderate expected postoperative tenderness. Incisions are clean dry and intact. No peritoneal signs.  Lab Results:   Basename 01/21/12 0552 01/20/12 0551  WBC 13.7* 14.3*  HGB 12.2 12.7  HCT 35.8* 37.1  PLT 216 197   BMET  Basename 01/19/12 1114  NA 134*  K 3.6  CL 98  CO2 26  GLUCOSE 141*  BUN 20  CREATININE 0.70  CALCIUM 9.8   PT/INR No results found for this basename: LABPROT:2,INR:2 in the last 72 hours ABG No results found for this basename: PHART:2,PCO2:2,PO2:2,HCO3:2 in the last 72 hours  Studies/Results: No results found.  Anti-infectives: Anti-infectives     Start     Dose/Rate Route Frequency Ordered Stop   01/20/12 1500   ertapenem (INVANZ) 1 g in sodium chloride 0.9 % 50 mL IVPB        1 g 100 mL/hr over 30 Minutes Intravenous Every 24 hours 01/19/12 2042     01/19/12 1445   ertapenem (INVANZ) 1 g in sodium chloride 0.9 % 50 mL IVPB        1 g 100 mL/hr over 30 Minutes Intravenous  Once 01/19/12 1441 01/19/12 1523          Assessment/Plan: s/p Procedure(s) (LRB) with comments: APPENDECTOMY LAPAROSCOPIC (N/A) Ruptured appendicitis. Continue IV antibiotic coverage. Continue to await for increased bowel function.  Low suspicion of intra-abdominal infectious process at this time however we'll continue to monitor patient's WBC count as well as symptoms. Should she continue to have nausea or evidence of a persistent infection we'll repeat a CAT scan likely Monday Tuesday otherwise continued increase activity.  LOS: 2 days    Candice Landry 01/21/2012

## 2012-01-22 LAB — CBC
Hemoglobin: 12.2 g/dL (ref 12.0–15.0)
MCH: 30.7 pg (ref 26.0–34.0)
MCHC: 34.3 g/dL (ref 30.0–36.0)
MCV: 89.4 fL (ref 78.0–100.0)

## 2012-01-22 NOTE — Progress Notes (Signed)
3 Days Post-Op  Subjective: Feels better.  Sore.  Tolerating PO.  Objective: Vital signs in last 24 hours: Temp:  [98 F (36.7 C)-98.5 F (36.9 C)] 98.2 F (36.8 C) (01/12 1510) Pulse Rate:  [82-84] 82  (01/12 1510) Resp:  [17-18] 18  (01/12 1510) BP: (111-130)/(67-75) 111/67 mmHg (01/12 1510) SpO2:  [97 %-99 %] 99 % (01/12 1510) Last BM Date: 01/22/12  Intake/Output from previous day: 01/11 0701 - 01/12 0700 In: 1060 [P.O.:960; IV Piggyback:100] Out: -  Intake/Output this shift:    General appearance: alert and no distress GI: +BS, soft, expected tenderness.    Lab Results:   [facility] 01/22/12 0618 01/21/12 0552  WBC 10.3 13.7*  HGB 12.2 12.2  HCT 35.6* 35.8*  PLT 229 216   BMET No results found for this basename: NA:2,K:2,CL:2,CO2:2,GLUCOSE:2,BUN:2,CREATININE:2,CALCIUM:2 in the last 72 hours PT/INR No results found for this basename: LABPROT:2,INR:2 in the last 72 hours ABG No results found for this basename: PHART:2,PCO2:2,PO2:2,HCO3:2 in the last 72 hours  Studies/Results: No results found.  Anti-infectives: Anti-infectives     Start     Dose/Rate Route Frequency Ordered Stop   01/20/12 1500   ertapenem (INVANZ) 1 g in sodium chloride 0.9 % 50 mL IVPB        1 g 100 mL/hr over 30 Minutes Intravenous Every 24 hours 01/19/12 2042     01/19/12 1445   ertapenem (INVANZ) 1 g in sodium chloride 0.9 % 50 mL IVPB        1 g 100 mL/hr over 30 Minutes Intravenous  Once 01/19/12 1441 01/19/12 1523          Assessment/Plan: s/p Procedure(s) (LRB) with comments: APPENDECTOMY LAPAROSCOPIC (N/A) Continue abx.  If WBC still normal tomorrow will plan to d/c tomorrow.    LOS: 3 days    Candice Landry C 01/22/2012

## 2012-01-23 ENCOUNTER — Encounter (HOSPITAL_COMMUNITY): Payer: Self-pay | Admitting: General Surgery

## 2012-01-23 LAB — CBC
MCH: 31.1 pg (ref 26.0–34.0)
MCHC: 34.5 g/dL (ref 30.0–36.0)
MCV: 90 fL (ref 78.0–100.0)
Platelets: 246 10*3/uL (ref 150–400)
RDW: 13.6 % (ref 11.5–15.5)
WBC: 10.6 10*3/uL — ABNORMAL HIGH (ref 4.0–10.5)

## 2012-01-23 MED ORDER — HYDROCODONE-ACETAMINOPHEN 5-325 MG PO TABS: 1.0000 | ORAL_TABLET | ORAL | Status: DC | PRN

## 2012-01-23 MED ORDER — AMOXICILLIN-POT CLAVULANATE 500-125 MG PO TABS: 1.0000 | ORAL_TABLET | Freq: Three times a day (TID) | ORAL | Status: DC

## 2012-01-23 NOTE — Discharge Summary (Signed)
Physician Discharge Summary  Patient ID: Candice Landry MRN: [id number] DOB/AGE: 1945-12-05 67 y.o.  Admit date: 01/19/2012 Discharge date: 01/23/2012  Admission Diagnoses: Acute appendicitis  Discharge Diagnoses: Ruptured appendicitis Active Problems:  * No active hospital problems. *    Discharged Condition: stable  Hospital Course: Patient presented to [facility] for right lower quadrant abdominal pain. Workup was consistent for acute appendicitis. At the time of her operation it was noted the patient actually had a ruptured appendicitis. She had copious intra-abdominal irrigation and washout. She tolerated procedure extremely well. Was transferred back to surgical floor was continue to monitor. She was continued on IV antibiotics due to the nature the appendicitis. She continued to demonstrate improvement of her leukocytosis. Her symptomatology continue to improve. Patient is currently tolerating regular diet. Her pain is controlled oral analgesia. She is able toward. White count is back to normal. She will be transitioned to oral antibiotics. Plans are made for discharge to home.  Consults: None  Significant Diagnostic Studies: radiology: CT scan: Abdomen and pelvis  Treatments: surgery: Laparoscopic appendectomy  Discharge Exam: Blood pressure 122/74, pulse 80, temperature 98.2 F (36.8 C), temperature source Oral, resp. rate 18, height 5\' 4"  (1.626 m), weight 65.6 kg (144 lb 10 oz), SpO2 97.00%. General appearance: alert and no distress Resp: clear to auscultation bilaterally Cardio: regular rate and rhythm GI: Positive bowel sounds, soft, expected postoperative tenderness. Incisions are clean dry and intact. Staples are present  Disposition: Final discharge disposition not confirmed  Discharge Orders    Future Orders Please Complete By Expires   Diet - low sodium heart healthy      Increase activity slowly      Discharge instructions      Comments:   Increase  activity as tolerated. May place ice pack for comfort.  Alternate an anti-inflammatory such as ibuprofen (Motrin, Advil) 400-600mg  every 6 hours with the prescribed pain medication.   Do not take any additional acetaminophen as there is Tylenol in the pain medication.   Driving Restrictions      Comments:   No driving while on pain medications.   Lifting restrictions      Comments:   No lifting over 20lbs for 4-5 weeks post-op.   Discharge wound care:      Comments:   Clean surgical sites with soap and water.  May shower the morning after surgery unless instructed by Dr. Leticia Penna otherwise.  No soaking for 2-3 weeks.    If adhesive strips are in place, they may be removed in 1-2 weeks while in the shower.   Call MD for:  temperature >100.4      Call MD for:  persistant nausea and vomiting      Call MD for:  severe uncontrolled pain      Call MD for:  redness, tenderness, or signs of infection (pain, swelling, redness, odor or green/yellow discharge around incision site)          Medication List     As of 01/23/2012 12:13 PM    TAKE these medications         amoxicillin-clavulanate 500-125 MG per tablet   Commonly known as: AUGMENTIN   Take 1 tablet (500 mg total) by mouth 3 (three) times daily.      HYDROcodone-acetaminophen 5-325 MG per tablet   Commonly known as: NORCO/VICODIN   Take 1-2 tablets by mouth every 4 (four) hours as needed.           Follow-up  Information    Follow up with Fabio Bering, MD. On 02/07/2012.   Contact information:   [address] [place] [address] [phone]          Signed: Fabio Bering 01/23/2012, 12:13 PM

## 2017-08-09 DIAGNOSIS — Z825 Family history of asthma and other chronic lower respiratory diseases: Secondary | ICD-10-CM | POA: Diagnosis not present

## 2017-08-09 DIAGNOSIS — Z8249 Family history of ischemic heart disease and other diseases of the circulatory system: Secondary | ICD-10-CM | POA: Diagnosis not present

## 2017-08-09 DIAGNOSIS — Z809 Family history of malignant neoplasm, unspecified: Secondary | ICD-10-CM | POA: Diagnosis not present

## 2017-08-09 DIAGNOSIS — Z87891 Personal history of nicotine dependence: Secondary | ICD-10-CM | POA: Diagnosis not present

## 2018-11-29 DIAGNOSIS — R100 Acute abdomen: Secondary | ICD-10-CM | POA: Diagnosis not present

## 2018-11-29 DIAGNOSIS — K219 Gastro-esophageal reflux disease without esophagitis: Secondary | ICD-10-CM | POA: Diagnosis not present

## 2018-11-29 DIAGNOSIS — Z0001 Encounter for general adult medical examination with abnormal findings: Secondary | ICD-10-CM | POA: Diagnosis not present

## 2018-11-29 DIAGNOSIS — Z23 Encounter for immunization: Secondary | ICD-10-CM | POA: Diagnosis not present

## 2018-11-29 DIAGNOSIS — E039 Hypothyroidism, unspecified: Secondary | ICD-10-CM | POA: Diagnosis not present

## 2018-11-29 DIAGNOSIS — Z0189 Encounter for other specified special examinations: Secondary | ICD-10-CM | POA: Diagnosis not present

## 2018-11-29 DIAGNOSIS — L989 Disorder of the skin and subcutaneous tissue, unspecified: Secondary | ICD-10-CM | POA: Diagnosis not present

## 2018-12-18 DIAGNOSIS — Z1321 Encounter for screening for nutritional disorder: Secondary | ICD-10-CM | POA: Diagnosis not present

## 2018-12-18 DIAGNOSIS — E039 Hypothyroidism, unspecified: Secondary | ICD-10-CM | POA: Diagnosis not present

## 2018-12-18 DIAGNOSIS — E559 Vitamin D deficiency, unspecified: Secondary | ICD-10-CM | POA: Diagnosis not present

## 2018-12-20 DIAGNOSIS — K219 Gastro-esophageal reflux disease without esophagitis: Secondary | ICD-10-CM | POA: Diagnosis not present

## 2018-12-20 DIAGNOSIS — E559 Vitamin D deficiency, unspecified: Secondary | ICD-10-CM | POA: Diagnosis not present

## 2018-12-20 DIAGNOSIS — L989 Disorder of the skin and subcutaneous tissue, unspecified: Secondary | ICD-10-CM | POA: Diagnosis not present

## 2018-12-20 DIAGNOSIS — E785 Hyperlipidemia, unspecified: Secondary | ICD-10-CM | POA: Diagnosis not present

## 2018-12-20 DIAGNOSIS — Z0189 Encounter for other specified special examinations: Secondary | ICD-10-CM | POA: Diagnosis not present

## 2018-12-20 DIAGNOSIS — E039 Hypothyroidism, unspecified: Secondary | ICD-10-CM | POA: Diagnosis not present

## 2018-12-20 DIAGNOSIS — R109 Unspecified abdominal pain: Secondary | ICD-10-CM | POA: Diagnosis not present

## 2018-12-21 ENCOUNTER — Other Ambulatory Visit: Payer: Self-pay | Admitting: Internal Medicine

## 2018-12-21 ENCOUNTER — Other Ambulatory Visit (HOSPITAL_COMMUNITY): Payer: Self-pay | Admitting: Internal Medicine

## 2018-12-21 DIAGNOSIS — R1011 Right upper quadrant pain: Secondary | ICD-10-CM

## 2019-01-31 ENCOUNTER — Other Ambulatory Visit (HOSPITAL_COMMUNITY): Payer: Self-pay | Admitting: Internal Medicine

## 2019-01-31 DIAGNOSIS — R1011 Right upper quadrant pain: Secondary | ICD-10-CM

## 2019-02-26 DIAGNOSIS — Z85820 Personal history of malignant melanoma of skin: Secondary | ICD-10-CM | POA: Diagnosis not present

## 2019-02-26 DIAGNOSIS — C44319 Basal cell carcinoma of skin of other parts of face: Secondary | ICD-10-CM | POA: Diagnosis not present

## 2019-02-26 DIAGNOSIS — D210 Benign neoplasm of connective and other soft tissue of head, face and neck: Secondary | ICD-10-CM | POA: Diagnosis not present

## 2019-02-26 DIAGNOSIS — D485 Neoplasm of uncertain behavior of skin: Secondary | ICD-10-CM | POA: Diagnosis not present

## 2019-02-26 DIAGNOSIS — L820 Inflamed seborrheic keratosis: Secondary | ICD-10-CM | POA: Diagnosis not present

## 2019-02-26 DIAGNOSIS — C44712 Basal cell carcinoma of skin of right lower limb, including hip: Secondary | ICD-10-CM | POA: Diagnosis not present

## 2019-02-26 DIAGNOSIS — L821 Other seborrheic keratosis: Secondary | ICD-10-CM | POA: Diagnosis not present

## 2019-02-26 DIAGNOSIS — L219 Seborrheic dermatitis, unspecified: Secondary | ICD-10-CM | POA: Diagnosis not present

## 2019-03-14 DIAGNOSIS — C44712 Basal cell carcinoma of skin of right lower limb, including hip: Secondary | ICD-10-CM | POA: Diagnosis not present

## 2019-03-14 DIAGNOSIS — C44319 Basal cell carcinoma of skin of other parts of face: Secondary | ICD-10-CM | POA: Diagnosis not present

## 2019-04-01 DIAGNOSIS — L989 Disorder of the skin and subcutaneous tissue, unspecified: Secondary | ICD-10-CM | POA: Diagnosis not present

## 2019-04-01 DIAGNOSIS — K219 Gastro-esophageal reflux disease without esophagitis: Secondary | ICD-10-CM | POA: Diagnosis not present

## 2019-04-01 DIAGNOSIS — Z0189 Encounter for other specified special examinations: Secondary | ICD-10-CM | POA: Diagnosis not present

## 2019-04-01 DIAGNOSIS — E559 Vitamin D deficiency, unspecified: Secondary | ICD-10-CM | POA: Diagnosis not present

## 2019-04-01 DIAGNOSIS — Z23 Encounter for immunization: Secondary | ICD-10-CM | POA: Diagnosis not present

## 2019-04-01 DIAGNOSIS — R100 Acute abdomen: Secondary | ICD-10-CM | POA: Diagnosis not present

## 2019-04-01 DIAGNOSIS — Z0001 Encounter for general adult medical examination with abnormal findings: Secondary | ICD-10-CM | POA: Diagnosis not present

## 2019-04-01 DIAGNOSIS — E039 Hypothyroidism, unspecified: Secondary | ICD-10-CM | POA: Diagnosis not present

## 2019-04-01 DIAGNOSIS — E785 Hyperlipidemia, unspecified: Secondary | ICD-10-CM | POA: Diagnosis not present

## 2019-04-01 DIAGNOSIS — R109 Unspecified abdominal pain: Secondary | ICD-10-CM | POA: Diagnosis not present

## 2019-04-04 ENCOUNTER — Other Ambulatory Visit (HOSPITAL_BASED_OUTPATIENT_CLINIC_OR_DEPARTMENT_OTHER): Payer: Self-pay | Admitting: Internal Medicine

## 2019-04-04 ENCOUNTER — Other Ambulatory Visit (HOSPITAL_COMMUNITY): Payer: Self-pay | Admitting: Internal Medicine

## 2019-04-04 DIAGNOSIS — Z23 Encounter for immunization: Secondary | ICD-10-CM | POA: Diagnosis not present

## 2019-04-04 DIAGNOSIS — Z1231 Encounter for screening mammogram for malignant neoplasm of breast: Secondary | ICD-10-CM

## 2019-04-04 DIAGNOSIS — E785 Hyperlipidemia, unspecified: Secondary | ICD-10-CM | POA: Diagnosis not present

## 2019-04-04 DIAGNOSIS — K219 Gastro-esophageal reflux disease without esophagitis: Secondary | ICD-10-CM | POA: Diagnosis not present

## 2019-04-04 DIAGNOSIS — Z0189 Encounter for other specified special examinations: Secondary | ICD-10-CM | POA: Diagnosis not present

## 2019-04-04 DIAGNOSIS — Z0001 Encounter for general adult medical examination with abnormal findings: Secondary | ICD-10-CM | POA: Diagnosis not present

## 2019-04-04 DIAGNOSIS — L989 Disorder of the skin and subcutaneous tissue, unspecified: Secondary | ICD-10-CM | POA: Diagnosis not present

## 2019-04-04 DIAGNOSIS — E039 Hypothyroidism, unspecified: Secondary | ICD-10-CM | POA: Diagnosis not present

## 2019-04-04 DIAGNOSIS — E559 Vitamin D deficiency, unspecified: Secondary | ICD-10-CM | POA: Diagnosis not present

## 2019-04-04 DIAGNOSIS — R100 Acute abdomen: Secondary | ICD-10-CM | POA: Diagnosis not present

## 2019-04-17 ENCOUNTER — Ambulatory Visit (HOSPITAL_COMMUNITY): Payer: Self-pay

## 2019-04-19 ENCOUNTER — Other Ambulatory Visit: Payer: Self-pay

## 2019-04-19 ENCOUNTER — Ambulatory Visit (HOSPITAL_COMMUNITY)
Admission: RE | Admit: 2019-04-19 | Discharge: 2019-04-19 | Disposition: A | Discharge status: Home / Self Care | Payer: Medicare HMO | Source: Ambulatory Visit | Attending: Internal Medicine | Admitting: Internal Medicine

## 2019-04-19 ENCOUNTER — Encounter (HOSPITAL_COMMUNITY): Payer: Self-pay

## 2019-04-19 DIAGNOSIS — Z1231 Encounter for screening mammogram for malignant neoplasm of breast: Secondary | ICD-10-CM | POA: Diagnosis not present

## 2019-09-18 DIAGNOSIS — D2271 Melanocytic nevi of right lower limb, including hip: Secondary | ICD-10-CM | POA: Diagnosis not present

## 2019-09-18 DIAGNOSIS — D239 Other benign neoplasm of skin, unspecified: Secondary | ICD-10-CM | POA: Diagnosis not present

## 2019-09-18 DIAGNOSIS — Z85828 Personal history of other malignant neoplasm of skin: Secondary | ICD-10-CM | POA: Diagnosis not present

## 2019-09-18 DIAGNOSIS — L570 Actinic keratosis: Secondary | ICD-10-CM | POA: Diagnosis not present

## 2019-09-18 DIAGNOSIS — D485 Neoplasm of uncertain behavior of skin: Secondary | ICD-10-CM | POA: Diagnosis not present

## 2019-09-18 DIAGNOSIS — L821 Other seborrheic keratosis: Secondary | ICD-10-CM | POA: Diagnosis not present

## 2019-09-18 DIAGNOSIS — C44619 Basal cell carcinoma of skin of left upper limb, including shoulder: Secondary | ICD-10-CM | POA: Diagnosis not present

## 2019-10-03 DIAGNOSIS — C44619 Basal cell carcinoma of skin of left upper limb, including shoulder: Secondary | ICD-10-CM | POA: Diagnosis not present

## 2019-10-03 DIAGNOSIS — L988 Other specified disorders of the skin and subcutaneous tissue: Secondary | ICD-10-CM | POA: Diagnosis not present

## 2019-10-07 DIAGNOSIS — E785 Hyperlipidemia, unspecified: Secondary | ICD-10-CM | POA: Diagnosis not present

## 2019-10-07 DIAGNOSIS — E559 Vitamin D deficiency, unspecified: Secondary | ICD-10-CM | POA: Diagnosis not present

## 2019-10-07 DIAGNOSIS — L989 Disorder of the skin and subcutaneous tissue, unspecified: Secondary | ICD-10-CM | POA: Diagnosis not present

## 2019-10-07 DIAGNOSIS — Z712 Person consulting for explanation of examination or test findings: Secondary | ICD-10-CM | POA: Diagnosis not present

## 2019-10-07 DIAGNOSIS — E039 Hypothyroidism, unspecified: Secondary | ICD-10-CM | POA: Diagnosis not present

## 2019-10-07 DIAGNOSIS — K219 Gastro-esophageal reflux disease without esophagitis: Secondary | ICD-10-CM | POA: Diagnosis not present

## 2019-10-07 DIAGNOSIS — Z0001 Encounter for general adult medical examination with abnormal findings: Secondary | ICD-10-CM | POA: Diagnosis not present

## 2019-10-07 DIAGNOSIS — R100 Acute abdomen: Secondary | ICD-10-CM | POA: Diagnosis not present

## 2019-10-07 DIAGNOSIS — Z23 Encounter for immunization: Secondary | ICD-10-CM | POA: Diagnosis not present

## 2019-10-07 DIAGNOSIS — R109 Unspecified abdominal pain: Secondary | ICD-10-CM | POA: Diagnosis not present

## 2019-10-10 DIAGNOSIS — Z23 Encounter for immunization: Secondary | ICD-10-CM | POA: Diagnosis not present

## 2019-10-10 DIAGNOSIS — K219 Gastro-esophageal reflux disease without esophagitis: Secondary | ICD-10-CM | POA: Diagnosis not present

## 2019-10-10 DIAGNOSIS — E785 Hyperlipidemia, unspecified: Secondary | ICD-10-CM | POA: Diagnosis not present

## 2019-10-10 DIAGNOSIS — E039 Hypothyroidism, unspecified: Secondary | ICD-10-CM | POA: Diagnosis not present

## 2019-10-10 DIAGNOSIS — E559 Vitamin D deficiency, unspecified: Secondary | ICD-10-CM | POA: Diagnosis not present

## 2020-01-20 DIAGNOSIS — R100 Acute abdomen: Secondary | ICD-10-CM | POA: Diagnosis not present

## 2020-01-20 DIAGNOSIS — E559 Vitamin D deficiency, unspecified: Secondary | ICD-10-CM | POA: Diagnosis not present

## 2020-01-20 DIAGNOSIS — Z0001 Encounter for general adult medical examination with abnormal findings: Secondary | ICD-10-CM | POA: Diagnosis not present

## 2020-01-20 DIAGNOSIS — Z23 Encounter for immunization: Secondary | ICD-10-CM | POA: Diagnosis not present

## 2020-01-20 DIAGNOSIS — L989 Disorder of the skin and subcutaneous tissue, unspecified: Secondary | ICD-10-CM | POA: Diagnosis not present

## 2020-01-20 DIAGNOSIS — Z712 Person consulting for explanation of examination or test findings: Secondary | ICD-10-CM | POA: Diagnosis not present

## 2020-01-20 DIAGNOSIS — E039 Hypothyroidism, unspecified: Secondary | ICD-10-CM | POA: Diagnosis not present

## 2020-01-20 DIAGNOSIS — K219 Gastro-esophageal reflux disease without esophagitis: Secondary | ICD-10-CM | POA: Diagnosis not present

## 2020-01-20 DIAGNOSIS — E785 Hyperlipidemia, unspecified: Secondary | ICD-10-CM | POA: Diagnosis not present

## 2020-01-20 DIAGNOSIS — R109 Unspecified abdominal pain: Secondary | ICD-10-CM | POA: Diagnosis not present

## 2020-01-23 DIAGNOSIS — E785 Hyperlipidemia, unspecified: Secondary | ICD-10-CM | POA: Diagnosis not present

## 2020-01-23 DIAGNOSIS — E559 Vitamin D deficiency, unspecified: Secondary | ICD-10-CM | POA: Diagnosis not present

## 2020-01-23 DIAGNOSIS — K219 Gastro-esophageal reflux disease without esophagitis: Secondary | ICD-10-CM | POA: Diagnosis not present

## 2020-01-23 DIAGNOSIS — E039 Hypothyroidism, unspecified: Secondary | ICD-10-CM | POA: Diagnosis not present

## 2020-07-22 DIAGNOSIS — E785 Hyperlipidemia, unspecified: Secondary | ICD-10-CM | POA: Diagnosis not present

## 2020-07-22 DIAGNOSIS — E039 Hypothyroidism, unspecified: Secondary | ICD-10-CM | POA: Diagnosis not present

## 2020-07-30 ENCOUNTER — Other Ambulatory Visit (HOSPITAL_COMMUNITY): Payer: Self-pay | Admitting: Family Medicine

## 2020-07-30 DIAGNOSIS — Z1231 Encounter for screening mammogram for malignant neoplasm of breast: Secondary | ICD-10-CM

## 2020-07-30 DIAGNOSIS — R1011 Right upper quadrant pain: Secondary | ICD-10-CM | POA: Diagnosis not present

## 2020-07-30 DIAGNOSIS — E782 Mixed hyperlipidemia: Secondary | ICD-10-CM | POA: Diagnosis not present

## 2020-07-30 DIAGNOSIS — E559 Vitamin D deficiency, unspecified: Secondary | ICD-10-CM | POA: Diagnosis not present

## 2020-07-30 DIAGNOSIS — E039 Hypothyroidism, unspecified: Secondary | ICD-10-CM | POA: Diagnosis not present

## 2020-07-30 DIAGNOSIS — K219 Gastro-esophageal reflux disease without esophagitis: Secondary | ICD-10-CM | POA: Diagnosis not present

## 2020-08-05 ENCOUNTER — Ambulatory Visit (HOSPITAL_COMMUNITY)
Admission: RE | Admit: 2020-08-05 | Discharge: 2020-08-05 | Disposition: A | Discharge status: Home / Self Care | Payer: Medicare HMO | Source: Ambulatory Visit | Attending: Family Medicine | Admitting: Family Medicine

## 2020-08-05 ENCOUNTER — Other Ambulatory Visit: Payer: Self-pay

## 2020-08-05 DIAGNOSIS — Z1231 Encounter for screening mammogram for malignant neoplasm of breast: Secondary | ICD-10-CM

## 2020-08-07 ENCOUNTER — Other Ambulatory Visit: Payer: Self-pay

## 2020-08-07 ENCOUNTER — Ambulatory Visit (HOSPITAL_COMMUNITY)
Admission: RE | Admit: 2020-08-07 | Discharge: 2020-08-07 | Disposition: A | Discharge status: Home / Self Care | Payer: Medicare HMO | Source: Ambulatory Visit | Attending: Family Medicine | Admitting: Family Medicine

## 2020-08-07 DIAGNOSIS — R1011 Right upper quadrant pain: Secondary | ICD-10-CM | POA: Insufficient documentation

## 2020-08-21 ENCOUNTER — Other Ambulatory Visit (HOSPITAL_COMMUNITY): Payer: Self-pay | Admitting: Family Medicine

## 2020-08-21 ENCOUNTER — Other Ambulatory Visit: Payer: Self-pay | Admitting: Family Medicine

## 2020-08-21 DIAGNOSIS — R1011 Right upper quadrant pain: Secondary | ICD-10-CM

## 2020-09-22 ENCOUNTER — Encounter (HOSPITAL_COMMUNITY): Payer: Self-pay | Admitting: Radiology

## 2020-09-22 ENCOUNTER — Ambulatory Visit (HOSPITAL_COMMUNITY)
Admission: RE | Admit: 2020-09-22 | Discharge: 2020-09-22 | Disposition: A | Discharge status: Home / Self Care | Payer: Medicare HMO | Source: Ambulatory Visit | Attending: Family Medicine | Admitting: Family Medicine

## 2020-09-22 ENCOUNTER — Other Ambulatory Visit: Payer: Self-pay

## 2020-09-22 DIAGNOSIS — K449 Diaphragmatic hernia without obstruction or gangrene: Secondary | ICD-10-CM | POA: Diagnosis not present

## 2020-09-22 DIAGNOSIS — R1011 Right upper quadrant pain: Secondary | ICD-10-CM | POA: Diagnosis not present

## 2020-09-22 DIAGNOSIS — K7689 Other specified diseases of liver: Secondary | ICD-10-CM | POA: Diagnosis not present

## 2020-09-22 DIAGNOSIS — N281 Cyst of kidney, acquired: Secondary | ICD-10-CM | POA: Diagnosis not present

## 2020-09-22 DIAGNOSIS — K429 Umbilical hernia without obstruction or gangrene: Secondary | ICD-10-CM | POA: Diagnosis not present

## 2020-09-22 LAB — POCT I-STAT CREATININE: Creatinine, Ser: 0.8 mg/dL (ref 0.44–1.00)

## 2020-09-22 MED ORDER — IOHEXOL 350 MG/ML SOLN
100.0000 mL | Freq: Once | INTRAVENOUS | Status: AC | PRN
  Administered 2020-09-22: 80 mL via INTRAVENOUS

## 2020-10-07 ENCOUNTER — Encounter: Payer: Self-pay | Admitting: Urology

## 2020-10-07 ENCOUNTER — Ambulatory Visit: Payer: Medicare HMO | Admitting: Urology

## 2020-10-07 ENCOUNTER — Other Ambulatory Visit: Payer: Self-pay

## 2020-10-07 VITALS — BP 169/84 | HR 69 | Temp 98.6°F | Ht 64.0 in | Wt 155.9 lb

## 2020-10-07 DIAGNOSIS — N281 Cyst of kidney, acquired: Secondary | ICD-10-CM

## 2020-10-07 NOTE — Patient Instructions (Signed)
Renal Mass  A renal mass is an abnormal growth in the kidney. It may be found while performing an MRI, CT scan, or ultrasound to evaluate other problems of the abdomen. A renal mass that is cancerous (malignant) may grow or spread quickly. Others are not cancerous (benign). Renal masses include: Tumors. These may be malignant or benign. The most common type of kidney cancer in adults is renal cell carcinoma. In children, the most common type of kidney cancer is Wilms tumor. The most common benign tumors of the kidney include renal adenomas, oncocytomas, and angiomyolipoma (AML). Cysts. These are fluid-filled sacs that form on or in the kidney. What are the causes? Certain types of cancers, infections, or injuries can cause a renal mass. It isnot always known what causes a cyst to develop in or on the kidney. What are the signs or symptoms? Often, a renal mass does not cause any signs or symptoms; most kidney cysts donot cause symptoms. How is this diagnosed? Your health care provider may recommend tests to diagnose the cause of your renal mass. These tests may be done if a renal mass is found: Physical exam. Blood tests. Urine tests. Imaging tests, such as ultrasound, CT scan, or MRI. Biopsy. This is a small sample that is removed from the renal mass and tested in a lab. The exact tests and how often they are done will depend on: The size and appearance of the renal mass. Risk factors or medical conditions that increase your risk for problems. Any symptoms associated with the renal mass, or concerns that you have about it. Tests and physical exams may be done once, or they may be done regularly for a period of time. Tests and exams that are done regularly will help monitorwhether the mass is growing and beginning to cause problems. How is this treated? Treatment is not always needed for this condition. Your health care provider may recommend careful monitoring and regular tests and exams.  Treatment willdepend on the cause of the mass. Treatment for a cancerous renal mass may include surgical removal,chemotherapy, radiation, or immunotherapy. Most kidney cysts do not need to be treated. Follow these instructions at home: What you need to do at home will depend on the cause of the mass. Follow the instructions that your health care provider gives to you. In general: Take over-the-counter and prescription medicines only as told by your health care provider. If you were prescribed an antibiotic medicine, take it as told by your health care provider. Do not stop taking the antibiotic even if you start to feel better. Follow any restrictions that are given to you by your health care provider. Keep all follow-up visits. This is important. You may need to see your health care provider once or twice a year to have CT scans and ultrasounds. These tests will show if your renal mass has changed or grown. Contact a health care provider if you: Have pain in your side or back (flank pain). Have a fever. Feel full soon after eating. Have pain or swelling in the abdomen. Lose weight. Get help right away if: Your pain gets worse. There is blood in your urine. You cannot urinate. You have chest pain. You have trouble breathing. These symptoms may represent a serious problem that is an emergency. Do not wait to see if the symptoms will go away. Get medical help right away. Call your local emergency services (911 in the [place] Summary A renal mass is an abnormal growth in the kidney.   It may be cancerous (malignant) and grow or spread quickly, or it may not be cancerous (benign). Renal masses often do not have any signs or symptoms. Renal masses may be found while performing an MRI, CT scan, or ultrasound for other problems of the abdomen. Your health care provider may recommend that you have tests to diagnose the cause of your renal mass. These may include a physical exam, blood tests, urine  tests, imaging, or a biopsy. Treatment is not always needed for this condition. Careful monitoring may be recommended. This information is not intended to replace advice given to you by your health care provider. Make sure you discuss any questions you have with your healthcare provider. Document Revised: 06/24/2019 Document Reviewed: 06/24/2019 Elsevier Patient Education  2022 Elsevier Inc.  

## 2020-10-07 NOTE — Progress Notes (Signed)
10/07/2020 10:10 AM   Candice Landry Dec 09, 1945 [id number]  Referring provider: Valentino Nose, FNP [address] [place],  [place] [address]  Renal cyst   HPI: Candice Landry is a 75yo here for evaluation of right renal cysts. Starting 2 year ago she developed intermittent right flank pain and indigestion. She underwent Abdominal US and then CT abd/pelvis. She was found to have multiple large complex renal cysts and multiple hepatic cysts. No LUTS and no hematuria. No other associated symptoms. No unexplained weight loss.    PMH: Past Medical History:  Diagnosis Date   Cancer ([place])    PONV (postoperative nausea and vomiting)     Surgical History: Past Surgical History:  Procedure Laterality Date   ABDOMINAL HYSTERECTOMY     LAPAROSCOPIC APPENDECTOMY  01/19/2012   Procedure: APPENDECTOMY LAPAROSCOPIC;  Surgeon: Donato Heinz, MD;  Location: [facility];  Service: General;  Laterality: N/A;    Home Medications:  Allergies as of 10/07/2020   No Known Allergies      Medication List        Accurate as of October 07, 2020 10:10 AM. If you have any questions, ask your nurse or doctor.          STOP taking these medications    amoxicillin-clavulanate 500-125 MG tablet Commonly known as: Augmentin Stopped by: Nicolette Bang, MD   HYDROcodone-acetaminophen 5-325 MG tablet Commonly known as: NORCO/VICODIN Stopped by: Nicolette Bang, MD       TAKE these medications    acetaminophen 325 MG tablet Commonly known as: TYLENOL Take 650 mg by mouth every 6 (six) hours as needed.   cholecalciferol 25 MCG (1000 UNIT) tablet Commonly known as: VITAMIN D3 Take 1,000 Units by mouth daily.   omeprazole 40 MG capsule Commonly known as: PRILOSEC Take 40 mg by mouth daily.   rosuvastatin 20 MG tablet Commonly known as: CRESTOR Take 20 mg by mouth at bedtime.        Allergies: No Known Allergies  Family History: Family History  Problem Relation Age of Onset    Breast cancer Maternal Aunt     Social History:  reports that she has been smoking cigarettes. She has been smoking an average of .5 packs per day. She does not have any smokeless tobacco history on file. She reports current alcohol use. No history on file for drug use.  ROS: All other review of systems were reviewed and are negative except what is noted above in HPI  Physical Exam: BP (!) 169/84 (BP Location: Left Arm, Patient Position: Sitting, Cuff Size: Normal)   Pulse 69   Temp 98.6 F (37 C)   Ht 5\' 4"  (1.626 m)   Wt 155 lb 14.4 oz (70.7 kg)   BMI 26.76 kg/m   Constitutional:  Alert and oriented, No acute distress. HEENT: [place] AT, moist mucus membranes.  Trachea midline, no masses. Cardiovascular: No clubbing, cyanosis, or edema. Respiratory: Normal respiratory effort, no increased work of breathing. GI: Abdomen is soft, nontender, nondistended, no abdominal masses GU: No CVA tenderness.  Lymph: No cervical or inguinal lymphadenopathy. Skin: No rashes, bruises or suspicious lesions. Neurologic: Grossly intact, no focal deficits, moving all 4 extremities. Psychiatric: Normal mood and affect.  Laboratory Data: Lab Results  Component Value Date   WBC 10.6 (H) 01/23/2012   HGB 12.5 01/23/2012   HCT 36.2 01/23/2012   MCV 90.0 01/23/2012   PLT 246 01/23/2012    Lab Results  Component Value Date  CREATININE 0.80 09/22/2020    No results found for: PSA  No results found for: TESTOSTERONE  No results found for: HGBA1C  Urinalysis    Component Value Date/Time   COLORURINE YELLOW 01/19/2012 1150   APPEARANCEUR CLEAR 01/19/2012 1150   LABSPEC >1.030 (H) 01/19/2012 1150   PHURINE 5.5 01/19/2012 1150   GLUCOSEU NEGATIVE 01/19/2012 1150   HGBUR SMALL (A) 01/19/2012 1150   BILIRUBINUR NEGATIVE 01/19/2012 1150   KETONESUR 15 (A) 01/19/2012 1150   PROTEINUR TRACE (A) 01/19/2012 1150   UROBILINOGEN 0.2 01/19/2012 1150   NITRITE NEGATIVE 01/19/2012 [place] 01/19/2012 1150    Lab Results  Component Value Date   BACTERIA MANY (A) 01/19/2012    Pertinent Imaging: CT 09/22/2020: Images reviewed and discussed with the patient No results found for this or any previous visit.  No results found for this or any previous visit.  No results found for this or any previous visit.  No results found for this or any previous visit.  No results found for this or any previous visit.  No results found for this or any previous visit.  No results found for this or any previous visit.  No results found for this or any previous visit.   Assessment & Plan:    1. Cyst of kidney, acquired -We will obtain an MRI abd - Urinalysis, Routine w reflex microscopic   No follow-ups on file.  Nicolette Bang, MD  [facility] [place]

## 2020-10-07 NOTE — Progress Notes (Signed)
Urological Symptom Review  Patient is experiencing the following symptoms: Weak stream   Review of Systems  Gastrointestinal (upper)  : Nausea  Gastrointestinal (lower) : Negative for lower GI symptoms  Constitutional : Fatigue  Skin: Negative for skin symptoms  Eyes: Negative for eye symptoms  Ear/Nose/Throat : Negative for Ear/Nose/Throat symptoms  Hematologic/Lymphatic: Negative for Hematologic/Lymphatic symptoms  Cardiovascular : Negative for cardiovascular symptoms  Respiratory : Negative for respiratory symptoms  Endocrine: Negative for endocrine symptoms  Musculoskeletal: Back pain  Neurological: Dizziness  Psychologic:

## 2020-11-12 ENCOUNTER — Other Ambulatory Visit: Payer: Self-pay

## 2020-11-12 ENCOUNTER — Ambulatory Visit (HOSPITAL_COMMUNITY)
Admission: RE | Admit: 2020-11-12 | Discharge: 2020-11-12 | Disposition: A | Discharge status: Home / Self Care | Payer: Medicare HMO | Source: Ambulatory Visit | Attending: Urology | Admitting: Urology

## 2020-11-12 DIAGNOSIS — N281 Cyst of kidney, acquired: Secondary | ICD-10-CM | POA: Diagnosis not present

## 2020-11-12 DIAGNOSIS — K7689 Other specified diseases of liver: Secondary | ICD-10-CM | POA: Diagnosis not present

## 2020-11-12 MED ORDER — GADOBUTROL 1 MMOL/ML IV SOLN
10.0000 mL | Freq: Once | INTRAVENOUS | Status: AC | PRN
  Administered 2020-11-12: 10 mL via INTRAVENOUS

## 2020-11-18 ENCOUNTER — Ambulatory Visit: Payer: Medicare HMO | Admitting: Urology

## 2020-11-18 ENCOUNTER — Encounter: Payer: Self-pay | Admitting: Urology

## 2020-11-18 ENCOUNTER — Other Ambulatory Visit: Payer: Self-pay

## 2020-11-18 VITALS — BP 157/80 | HR 87

## 2020-11-18 DIAGNOSIS — N281 Cyst of kidney, acquired: Secondary | ICD-10-CM | POA: Diagnosis not present

## 2020-11-18 NOTE — Progress Notes (Signed)
Urological Symptom Review  Patient is experiencing the following symptoms: Get up at night to urinate Weak stream   Review of Systems  Gastrointestinal (upper)  : Nausea Indigestion/heartburn  Gastrointestinal (lower) : Constipation  Constitutional : Negative for symptoms  Skin: Itching  Eyes: Negative for eye symptoms  Ear/Nose/Throat : Negative for Ear/Nose/Throat symptoms  Hematologic/Lymphatic: Easy bruising  Cardiovascular : Negative for cardiovascular symptoms  Respiratory : Negative for respiratory symptoms  Endocrine: Negative for endocrine symptoms  Musculoskeletal: Back pain  Neurological: Headaches Dizziness  Psychologic: Negative for psychiatric symptoms

## 2020-11-18 NOTE — Patient Instructions (Signed)
Renal Mass  A renal mass is an abnormal growth in the kidney. It may be found while performing an MRI, CT scan, or ultrasound to evaluate other problems of the abdomen. A renal mass that is cancerous (malignant) may grow or spread quickly. Others are not cancerous (benign). Renal masses include: Tumors. These may be malignant or benign. The most common type of kidney cancer in adults is renal cell carcinoma. In children, the most common type of kidney cancer is Wilms tumor. The most common benign tumors of the kidney include renal adenomas, oncocytomas, and angiomyolipoma (AML). Cysts. These are fluid-filled sacs that form on or in the kidney. What are the causes? Certain types of cancers, infections, or injuries can cause a renal mass. It isnot always known what causes a cyst to develop in or on the kidney. What are the signs or symptoms? Often, a renal mass does not cause any signs or symptoms; most kidney cysts donot cause symptoms. How is this diagnosed? Your health care provider may recommend tests to diagnose the cause of your renal mass. These tests may be done if a renal mass is found: Physical exam. Blood tests. Urine tests. Imaging tests, such as ultrasound, CT scan, or MRI. Biopsy. This is a small sample that is removed from the renal mass and tested in a lab. The exact tests and how often they are done will depend on: The size and appearance of the renal mass. Risk factors or medical conditions that increase your risk for problems. Any symptoms associated with the renal mass, or concerns that you have about it. Tests and physical exams may be done once, or they may be done regularly for a period of time. Tests and exams that are done regularly will help monitorwhether the mass is growing and beginning to cause problems. How is this treated? Treatment is not always needed for this condition. Your health care provider may recommend careful monitoring and regular tests and exams.  Treatment willdepend on the cause of the mass. Treatment for a cancerous renal mass may include surgical removal,chemotherapy, radiation, or immunotherapy. Most kidney cysts do not need to be treated. Follow these instructions at home: What you need to do at home will depend on the cause of the mass. Follow the instructions that your health care provider gives to you. In general: Take over-the-counter and prescription medicines only as told by your health care provider. If you were prescribed an antibiotic medicine, take it as told by your health care provider. Do not stop taking the antibiotic even if you start to feel better. Follow any restrictions that are given to you by your health care provider. Keep all follow-up visits. This is important. You may need to see your health care provider once or twice a year to have CT scans and ultrasounds. These tests will show if your renal mass has changed or grown. Contact a health care provider if you: Have pain in your side or back (flank pain). Have a fever. Feel full soon after eating. Have pain or swelling in the abdomen. Lose weight. Get help right away if: Your pain gets worse. There is blood in your urine. You cannot urinate. You have chest pain. You have trouble breathing. These symptoms may represent a serious problem that is an emergency. Do not wait to see if the symptoms will go away. Get medical help right away. Call your local emergency services (911 in the [place] Summary A renal mass is an abnormal growth in the kidney.   It may be cancerous (malignant) and grow or spread quickly, or it may not be cancerous (benign). Renal masses often do not have any signs or symptoms. Renal masses may be found while performing an MRI, CT scan, or ultrasound for other problems of the abdomen. Your health care provider may recommend that you have tests to diagnose the cause of your renal mass. These may include a physical exam, blood tests, urine  tests, imaging, or a biopsy. Treatment is not always needed for this condition. Careful monitoring may be recommended. This information is not intended to replace advice given to you by your health care provider. Make sure you discuss any questions you have with your healthcare provider. Document Revised: 06/24/2019 Document Reviewed: 06/24/2019 Elsevier Patient Education  2022 Elsevier Inc.  

## 2020-11-18 NOTE — Progress Notes (Signed)
11/18/2020 1:19 PM   Candice Landry January 01, 1946 [id number]  Referring provider: Celene Squibb, MD [place],  [place] [address]  Followup renal cyst   HPI: Ms Candice Landry is a 75yo here for followup for a right renal cyst. She underwent MRI 11/12/2020 which showed a large right lower pole simple renal cyst. She has intermittent right abdominal pain with activity related to the cyst. She denies any LUTS. No hematuria    PMH: Past Medical History:  Diagnosis Date   Cancer ([place])    PONV (postoperative nausea and vomiting)     Surgical History: Past Surgical History:  Procedure Laterality Date   ABDOMINAL HYSTERECTOMY     LAPAROSCOPIC APPENDECTOMY  01/19/2012   Procedure: APPENDECTOMY LAPAROSCOPIC;  Surgeon: Donato Heinz, MD;  Location: [facility];  Service: General;  Laterality: N/A;    Home Medications:  Allergies as of 11/18/2020   No Known Allergies      Medication List        Accurate as of November 18, 2020  1:19 PM. If you have any questions, ask your nurse or doctor.          acetaminophen 325 MG tablet Commonly known as: TYLENOL Take 650 mg by mouth every 6 (six) hours as needed.   cholecalciferol 25 MCG (1000 UNIT) tablet Commonly known as: VITAMIN D3 Take 1,000 Units by mouth daily.   omeprazole 40 MG capsule Commonly known as: PRILOSEC Take 40 mg by mouth daily.   rosuvastatin 20 MG tablet Commonly known as: CRESTOR Take 20 mg by mouth at bedtime.   rosuvastatin 40 MG tablet Commonly known as: CRESTOR Take 40 mg by mouth daily.        Allergies: No Known Allergies  Family History: Family History  Problem Relation Age of Onset   Breast cancer Maternal Aunt     Social History:  reports that she has been smoking cigarettes. She has been smoking an average of .5 packs per day. She does not have any smokeless tobacco history on file. She reports current alcohol use. No history on file for drug use.  ROS: All other review of systems  were reviewed and are negative except what is noted above in HPI  Physical Exam: BP (!) 157/80   Pulse 87   Constitutional:  Alert and oriented, No acute distress. HEENT:  AT, moist mucus membranes.  Trachea midline, no masses. Cardiovascular: No clubbing, cyanosis, or edema. Respiratory: Normal respiratory effort, no increased work of breathing. GI: Abdomen is soft, nontender, nondistended, no abdominal masses GU: No CVA tenderness.  Lymph: No cervical or inguinal lymphadenopathy. Skin: No rashes, bruises or suspicious lesions. Neurologic: Grossly intact, no focal deficits, moving all 4 extremities. Psychiatric: Normal mood and affect.  Laboratory Data: Lab Results  Component Value Date   WBC 10.6 (H) 01/23/2012   HGB 12.5 01/23/2012   HCT 36.2 01/23/2012   MCV 90.0 01/23/2012   PLT 246 01/23/2012    Lab Results  Component Value Date   CREATININE 0.80 09/22/2020    No results found for: PSA  No results found for: TESTOSTERONE  No results found for: HGBA1C  Urinalysis    Component Value Date/Time   COLORURINE YELLOW 01/19/2012 1150   APPEARANCEUR CLEAR 01/19/2012 1150   LABSPEC >1.030 (H) 01/19/2012 1150   PHURINE 5.5 01/19/2012 1150   GLUCOSEU NEGATIVE 01/19/2012 1150   HGBUR SMALL (A) 01/19/2012 1150   BILIRUBINUR NEGATIVE 01/19/2012 1150   KETONESUR 15 (A)  01/19/2012 1150   PROTEINUR TRACE (A) 01/19/2012 1150   UROBILINOGEN 0.2 01/19/2012 1150   NITRITE NEGATIVE 01/19/2012 [place] 01/19/2012 1150    Lab Results  Component Value Date   BACTERIA MANY (A) 01/19/2012    Pertinent Imaging: MRI 11/12/2020: Images reviewed and discussed with the patient  No results found for this or any previous visit.  No results found for this or any previous visit.  No results found for this or any previous visit.  No results found for this or any previous visit.  No results found for this or any previous visit.  No results found for this or  any previous visit.  No results found for this or any previous visit.  No results found for this or any previous visit.   Assessment & Plan:    1. Cyst of kidney, acquired We discussed the management of large renal cysts including observation, aspiration/sclerosing, and robotic cyst decortication. After discussing the options the patient elects for cyst aspiration. I will refer her to interventional radiology for cyst aspiration/sclerosing   No follow-ups on file.  Candice Bang, MD  [facility] [place]

## 2021-01-18 ENCOUNTER — Other Ambulatory Visit: Payer: Self-pay

## 2021-01-18 ENCOUNTER — Ambulatory Visit (INDEPENDENT_AMBULATORY_CARE_PROVIDER_SITE_OTHER): Payer: Medicare HMO | Admitting: Urology

## 2021-01-18 VITALS — BP 160/83 | HR 103

## 2021-01-18 DIAGNOSIS — N281 Cyst of kidney, acquired: Secondary | ICD-10-CM

## 2021-01-18 NOTE — Progress Notes (Signed)
Urological Symptom Review  Patient is experiencing the following symptoms: Weak stream   Review of Systems  Gastrointestinal (upper)  : Indigestion/heartburn  Gastrointestinal (lower) : Negative for lower GI symptoms  Constitutional : Negative for symptoms  Skin: Itching  Eyes: Negative for eye symptoms  Ear/Nose/Throat : Sinus problems  Hematologic/Lymphatic: Negative for Hematologic/Lymphatic symptoms  Cardiovascular : Negative for cardiovascular symptoms  Respiratory : Cough  Endocrine: Negative for endocrine symptoms  Musculoskeletal: Negative for musculoskeletal symptoms  Neurological: Negative for neurological symptoms  Psychologic: Negative for psychiatric symptoms

## 2021-01-19 NOTE — Progress Notes (Signed)
Patient rescheduled

## 2021-01-26 ENCOUNTER — Telehealth: Payer: Self-pay

## 2021-01-26 NOTE — Telephone Encounter (Signed)
Patient called and wanted to make you aware that she had not heard from the hospital yet from a referral you made. I wanted to verify what patient was referring to. She was not sure who the provider was.

## 2021-01-29 DIAGNOSIS — E782 Mixed hyperlipidemia: Secondary | ICD-10-CM | POA: Diagnosis not present

## 2021-01-29 DIAGNOSIS — Z23 Encounter for immunization: Secondary | ICD-10-CM | POA: Diagnosis not present

## 2021-02-02 DIAGNOSIS — Z23 Encounter for immunization: Secondary | ICD-10-CM | POA: Diagnosis not present

## 2021-02-02 DIAGNOSIS — E559 Vitamin D deficiency, unspecified: Secondary | ICD-10-CM | POA: Diagnosis not present

## 2021-02-02 DIAGNOSIS — E039 Hypothyroidism, unspecified: Secondary | ICD-10-CM | POA: Diagnosis not present

## 2021-02-02 DIAGNOSIS — Z0001 Encounter for general adult medical examination with abnormal findings: Secondary | ICD-10-CM | POA: Diagnosis not present

## 2021-02-11 ENCOUNTER — Telehealth: Payer: Self-pay

## 2021-02-11 NOTE — Telephone Encounter (Signed)
Patient left a voicemail:  Needing a call back asap regarding a procedure to be done at [facility] on her kidneys.   Has not heard any info and wanting to know when it is to be done.  Please advise.  Call back at:  [phone]  Thanks, Helene Kelp

## 2021-02-11 NOTE — Telephone Encounter (Signed)
Candice Landry,  Looks like an IR referral for this patient. Can you set this up for her?  Thanks,

## 2021-02-12 NOTE — Telephone Encounter (Signed)
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

## 2021-02-25 ENCOUNTER — Other Ambulatory Visit: Payer: Self-pay

## 2021-04-05 ENCOUNTER — Encounter: Payer: Self-pay | Admitting: Urology

## 2021-04-05 ENCOUNTER — Ambulatory Visit: Payer: Medicare HMO | Admitting: Urology

## 2021-04-05 ENCOUNTER — Other Ambulatory Visit: Payer: Self-pay

## 2021-04-05 VITALS — BP 172/71 | HR 79

## 2021-04-05 DIAGNOSIS — N281 Cyst of kidney, acquired: Secondary | ICD-10-CM

## 2021-04-05 LAB — MICROSCOPIC EXAMINATION: Renal Epithel, UA: NONE SEEN /hpf

## 2021-04-05 LAB — URINALYSIS, ROUTINE W REFLEX MICROSCOPIC
Bilirubin, UA: NEGATIVE
Glucose, UA: NEGATIVE
Ketones, UA: NEGATIVE
Nitrite, UA: NEGATIVE
Protein,UA: NEGATIVE
Specific Gravity, UA: 1.015 (ref 1.005–1.030)
Urobilinogen, Ur: 0.2 mg/dL (ref 0.2–1.0)
pH, UA: 5.5 (ref 5.0–7.5)

## 2021-04-05 NOTE — Progress Notes (Signed)
? ?04/05/2021 ?3:10 PM  ? ?Candice Landry ?Oct 07, 1945 ?[id number] ? ?Referring provider: Celene Squibb, MD ?[address] ?Candice Landry ?[place],  [place] [address] ? ?Followup right renal cyst ? ? ?HPI: ?Candice Landry is a 76yo here for followuop for a right renal cyst. She is having worsening right flank and abdominal pain since last visit. NO significant LUTS. She has not been scheduled for cyst aspiration. No other complaints today ? ? ?PMH: ?Past Medical History:  ?Diagnosis Date  ? Cancer [facility])   ? PONV (postoperative nausea and vomiting)   ? ? ?Surgical History: ?Past Surgical History:  ?Procedure Laterality Date  ? ABDOMINAL HYSTERECTOMY    ? LAPAROSCOPIC APPENDECTOMY  01/19/2012  ? Procedure: APPENDECTOMY LAPAROSCOPIC;  Surgeon: Donato Heinz, MD;  Location: [facility];  Service: General;  Laterality: N/A;  ? ? ?Home Medications:  ?Allergies as of 04/05/2021   ?No Known Allergies ?  ? ?  ?Medication List  ?  ? ?  ? Accurate as of April 05, 2021  3:10 PM. If you have any questions, ask your nurse or doctor.  ?  ?  ? ?  ? ?acetaminophen 325 MG tablet ?Commonly known as: TYLENOL ?Take 650 mg by mouth every 6 (six) hours as needed. ?  ?cholecalciferol 25 MCG (1000 UNIT) tablet ?Commonly known as: VITAMIN D3 ?Take 1,000 Units by mouth daily. ?  ?omeprazole 40 MG capsule ?Commonly known as: PRILOSEC ?Take 40 mg by mouth daily. ?  ?rosuvastatin 20 MG tablet ?Commonly known as: CRESTOR ?Take 20 mg by mouth at bedtime. ?  ?rosuvastatin 40 MG tablet ?Commonly known as: CRESTOR ?Take 40 mg by mouth daily. ?  ? ?  ? ? ?Allergies: No Known Allergies ? ?Family History: ?Family History  ?Problem Relation Age of Onset  ? Breast cancer Maternal Aunt   ? ? ?Social History:  reports that she has been smoking cigarettes. She has been smoking an average of .5 packs per day. She does not have any smokeless tobacco history on file. She reports current alcohol use. No history on file for drug use. ? ?ROS: ?All other review of systems were reviewed and are  negative except what is noted above in HPI ? ?Physical Exam: ?BP (!) 172/71   Pulse 79   ?Constitutional:  Alert and oriented, No acute distress. ?HEENT: [place] AT, moist mucus membranes.  Trachea midline, no masses. ?Cardiovascular: No clubbing, cyanosis, or edema. ?Respiratory: Normal respiratory effort, no increased work of breathing. ?GI: Abdomen is soft, nontender, nondistended, no abdominal masses ?GU: No CVA tenderness.  ?Lymph: No cervical or inguinal lymphadenopathy. ?Skin: No rashes, bruises or suspicious lesions. ?Neurologic: Grossly intact, no focal deficits, moving all 4 extremities. ?Psychiatric: Normal mood and affect. ? ?Laboratory Data: ?Lab Results  ?Component Value Date  ? WBC 10.6 (H) 01/23/2012  ? HGB 12.5 01/23/2012  ? HCT 36.2 01/23/2012  ? MCV 90.0 01/23/2012  ? PLT 246 01/23/2012  ? ? ?Lab Results  ?Component Value Date  ? CREATININE 0.80 09/22/2020  ? ? ?No results found for: PSA ? ?No results found for: TESTOSTERONE ? ?No results found for: HGBA1C ? ?Urinalysis ?   ?Component Value Date/Time  ? COLORURINE YELLOW 01/19/2012 1150  ? APPEARANCEUR CLEAR 01/19/2012 1150  ? LABSPEC >1.030 (H) 01/19/2012 1150  ? PHURINE 5.5 01/19/2012 1150  ? GLUCOSEU NEGATIVE 01/19/2012 1150  ? HGBUR SMALL (A) 01/19/2012 1150  ? [place] NEGATIVE 01/19/2012 1150  ? KETONESUR 15 (A) 01/19/2012 1150  ? PROTEINUR  TRACE (A) 01/19/2012 1150  ? UROBILINOGEN 0.2 01/19/2012 1150  ? NITRITE NEGATIVE 01/19/2012 1150  ? LEUKOCYTESUR NEGATIVE 01/19/2012 1150  ? ? ?Lab Results  ?Component Value Date  ? BACTERIA MANY (A) 01/19/2012  ? ? ?Pertinent Imaging: ? ?No results found for this or any previous visit. ? ?No results found for this or any previous visit. ? ?No results found for this or any previous visit. ? ?No results found for this or any previous visit. ? ?No results found for this or any previous visit. ? ?No results found for this or any previous visit. ? ?No results found for this or any previous visit. ? ?No  results found for this or any previous visit. ? ? ?Assessment & Plan:   ? ?1. Cyst of kidney, acquired ?-Referral to IR for right renal cyst aspiration ?- Urinalysis, Routine w reflex microscopic ? ? ?No follow-ups on file. ? ?Nicolette Bang, MD ? ?[facility] [place] ?  ?

## 2021-04-05 NOTE — Patient Instructions (Signed)
Renal Mass  A renal mass is an abnormal growth in the kidney. It may be found while performing an MRI, CT scan, or ultrasound to evaluate other problems of the abdomen. A renal mass that is cancerous (malignant) may grow or spread quickly. Others are not cancerous (benign). Renal masses include: Tumors. These may be malignant or benign. The most common type of kidney cancer in adults is renal cell carcinoma. In children, the most common type of kidney cancer is Wilms tumor. The most common benign tumors of the kidney include renal adenomas, oncocytomas, and angiomyolipoma (AML). Cysts. These are fluid-filled sacs that form on or in the kidney. What are the causes? Certain types of cancers, infections, or injuries can cause a renal mass. It isnot always known what causes a cyst to develop in or on the kidney. What are the signs or symptoms? Often, a renal mass does not cause any signs or symptoms; most kidney cysts donot cause symptoms. How is this diagnosed? Your health care provider may recommend tests to diagnose the cause of your renal mass. These tests may be done if a renal mass is found: Physical exam. Blood tests. Urine tests. Imaging tests, such as ultrasound, CT scan, or MRI. Biopsy. This is a small sample that is removed from the renal mass and tested in a lab. The exact tests and how often they are done will depend on: The size and appearance of the renal mass. Risk factors or medical conditions that increase your risk for problems. Any symptoms associated with the renal mass, or concerns that you have about it. Tests and physical exams may be done once, or they may be done regularly for a period of time. Tests and exams that are done regularly will help monitorwhether the mass is growing and beginning to cause problems. How is this treated? Treatment is not always needed for this condition. Your health care provider may recommend careful monitoring and regular tests and exams.  Treatment willdepend on the cause of the mass. Treatment for a cancerous renal mass may include surgical removal,chemotherapy, radiation, or immunotherapy. Most kidney cysts do not need to be treated. Follow these instructions at home: What you need to do at home will depend on the cause of the mass. Follow the instructions that your health care provider gives to you. In general: Take over-the-counter and prescription medicines only as told by your health care provider. If you were prescribed an antibiotic medicine, take it as told by your health care provider. Do not stop taking the antibiotic even if you start to feel better. Follow any restrictions that are given to you by your health care provider. Keep all follow-up visits. This is important. You may need to see your health care provider once or twice a year to have CT scans and ultrasounds. These tests will show if your renal mass has changed or grown. Contact a health care provider if you: Have pain in your side or back (flank pain). Have a fever. Feel full soon after eating. Have pain or swelling in the abdomen. Lose weight. Get help right away if: Your pain gets worse. There is blood in your urine. You cannot urinate. You have chest pain. You have trouble breathing. These symptoms may represent a serious problem that is an emergency. Do not wait to see if the symptoms will go away. Get medical help right away. Call your local emergency services (911 in the [place] Summary A renal mass is an abnormal growth in the kidney.   It may be cancerous (malignant) and grow or spread quickly, or it may not be cancerous (benign). Renal masses often do not have any signs or symptoms. Renal masses may be found while performing an MRI, CT scan, or ultrasound for other problems of the abdomen. Your health care provider may recommend that you have tests to diagnose the cause of your renal mass. These may include a physical exam, blood tests, urine  tests, imaging, or a biopsy. Treatment is not always needed for this condition. Careful monitoring may be recommended. This information is not intended to replace advice given to you by your health care provider. Make sure you discuss any questions you have with your healthcare provider. Document Revised: 06/24/2019 Document Reviewed: 06/24/2019 Elsevier Patient Education  2022 Elsevier Inc.  

## 2021-04-09 ENCOUNTER — Encounter: Payer: Self-pay | Admitting: *Deleted

## 2021-04-09 NOTE — Progress Notes (Unsigned)
Candice Cleveland, MD  Candice Landry ?Ok  ?CT aspiration RLP renal cyst  ?Send for cytology  ? ?DDH   ?  ?   ?Previous Messages ?  ?----- Message -----  ?From: Candice Landry  ?Sent: 04/05/2021   3:33 PM EDT  ?To: Ir Procedure Requests  ?Subject: CT Biopsy                                      ? ? ? ?Ct Biopsy  ? ?Cyst of kidney, acquired  ? ?MRI done 11/2020 in epic  ? ?Dr. Alyson Ingles, [name]  ?[phone]  ?

## 2021-04-19 ENCOUNTER — Other Ambulatory Visit: Payer: Self-pay | Admitting: Internal Medicine

## 2021-04-20 ENCOUNTER — Encounter (HOSPITAL_COMMUNITY): Payer: Self-pay

## 2021-04-20 ENCOUNTER — Other Ambulatory Visit: Payer: Self-pay | Admitting: Urology

## 2021-04-20 ENCOUNTER — Other Ambulatory Visit: Payer: Self-pay | Admitting: Radiology

## 2021-04-20 ENCOUNTER — Other Ambulatory Visit (HOSPITAL_COMMUNITY): Payer: Self-pay | Admitting: Urology

## 2021-04-20 ENCOUNTER — Ambulatory Visit (HOSPITAL_COMMUNITY)
Admission: RE | Admit: 2021-04-20 | Discharge: 2021-04-20 | Disposition: A | Discharge status: Home / Self Care | Payer: Medicare HMO | Source: Ambulatory Visit | Attending: Urology | Admitting: Urology

## 2021-04-20 ENCOUNTER — Other Ambulatory Visit: Payer: Self-pay

## 2021-04-20 DIAGNOSIS — R109 Unspecified abdominal pain: Secondary | ICD-10-CM | POA: Diagnosis not present

## 2021-04-20 DIAGNOSIS — N281 Cyst of kidney, acquired: Secondary | ICD-10-CM | POA: Insufficient documentation

## 2021-04-20 DIAGNOSIS — K7689 Other specified diseases of liver: Secondary | ICD-10-CM | POA: Insufficient documentation

## 2021-04-20 LAB — CBC WITH DIFFERENTIAL/PLATELET
Abs Immature Granulocytes: 0.04 10*3/uL (ref 0.00–0.07)
Basophils Absolute: 0.1 10*3/uL (ref 0.0–0.1)
Basophils Relative: 1 %
Eosinophils Absolute: 0.1 10*3/uL (ref 0.0–0.5)
Eosinophils Relative: 1 %
HCT: 41.5 % (ref 36.0–46.0)
Hemoglobin: 14 g/dL (ref 12.0–15.0)
Immature Granulocytes: 0 %
Lymphocytes Relative: 30 %
Lymphs Abs: 3.2 10*3/uL (ref 0.7–4.0)
MCH: 30.7 pg (ref 26.0–34.0)
MCHC: 33.7 g/dL (ref 30.0–36.0)
MCV: 91 fL (ref 80.0–100.0)
Monocytes Absolute: 0.6 10*3/uL (ref 0.1–1.0)
Monocytes Relative: 5 %
Neutro Abs: 6.8 10*3/uL (ref 1.7–7.7)
Neutrophils Relative %: 63 %
Platelets: 207 10*3/uL (ref 150–400)
RBC: 4.56 MIL/uL (ref 3.87–5.11)
RDW: 13.5 % (ref 11.5–15.5)
WBC: 10.8 10*3/uL — ABNORMAL HIGH (ref 4.0–10.5)
nRBC: 0 % (ref 0.0–0.2)

## 2021-04-20 LAB — BASIC METABOLIC PANEL
Anion gap: 5 (ref 5–15)
BUN: 16 mg/dL (ref 8–23)
CO2: 26 mmol/L (ref 22–32)
Calcium: 9.6 mg/dL (ref 8.9–10.3)
Chloride: 110 mmol/L (ref 98–111)
Creatinine, Ser: 0.79 mg/dL (ref 0.44–1.00)
GFR, Estimated: >60 mL/min (ref >60)
Glucose, Bld: 102 mg/dL — ABNORMAL HIGH (ref 70–99)
Potassium: 4.4 mmol/L (ref 3.5–5.1)
Sodium: 141 mmol/L (ref 135–145)

## 2021-04-20 LAB — PROTIME-INR
INR: 0.9 (ref 0.8–1.2)
Prothrombin Time: 12.4 seconds (ref 11.4–15.2)

## 2021-04-20 MED ORDER — FENTANYL CITRATE (PF) 100 MCG/2ML IJ SOLN
INTRAMUSCULAR | Status: DC | PRN
  Administered 2021-04-20 (×2): 50 ug via INTRAVENOUS

## 2021-04-20 MED ORDER — SODIUM CHLORIDE 0.9 % IV SOLN: INTRAVENOUS | Status: DC

## 2021-04-20 MED ORDER — LIDOCAINE HCL (PF) 1 % IJ SOLN
INTRAMUSCULAR | Status: AC
  Filled 2021-04-20: qty 30

## 2021-04-20 MED ORDER — MIDAZOLAM HCL 2 MG/2ML IJ SOLN
INTRAMUSCULAR | Status: DC | PRN
  Administered 2021-04-20: .5 mg via INTRAVENOUS
  Administered 2021-04-20 (×2): 1 mg via INTRAVENOUS

## 2021-04-20 MED ORDER — FENTANYL CITRATE (PF) 100 MCG/2ML IJ SOLN
INTRAMUSCULAR | Status: AC
  Filled 2021-04-20: qty 4

## 2021-04-20 MED ORDER — MIDAZOLAM HCL 2 MG/2ML IJ SOLN
INTRAMUSCULAR | Status: AC
  Filled 2021-04-20: qty 4

## 2021-04-20 NOTE — Sedation Documentation (Signed)
Dr. Kathlene Cote at bedside to discuss case with pt.  ?

## 2021-04-20 NOTE — Procedures (Signed)
Interventional Radiology Procedure Note ? ?Procedure: US guided aspiration of right renal cyst ? ?Complications: None ? ?Estimated Blood Loss: None ? ?Findings: ?Aspiration of largest right LP renal cyst with 5 Fr Yueh catheter yielded 160 mL of clear, light yellow fluid resulting in complete decompression of cyst by [place]. 60 mL of fluid sent for cytologic analysis. ? ?Eulas Post T. Kathlene Cote, M.D ?Pager:  [phone] ? ?  ?

## 2021-04-20 NOTE — H&P (Addendum)
? ?Chief Complaint: ?Patient was seen in consultation today for right renal cyst aspiration at the request of McKenzie,Patrick L ? ?Referring Physician(s): ?McKenzie,Patrick L ? ?Supervising Physician: Aletta Edouard ? ?Patient Status: [facility] - Out-pt ? ?History of Present Illness: ?Candice Landry is a 76 y.o. female  ? ?Rt flank pain over 1 year ?Worsening per pt ?No hematuria ?No urgency; No incontinence per pt ?Was referred to Dr Alyson Ingles ?MRI 10/07/20: ?IMPRESSION: ?1. Multiple Bosniak class 1 and Bosniak class 2 cysts in the kidneys ?bilaterally, as above. The largest of these is a exophytic simple ?cyst in the lower pole of the right kidney measuring 7.0 x 6.1 x 7.0 ?cm. ?2. Multiple small hepatic cysts also incidentally noted. ?  ?With pain that is worsening - ?Request made for aspiration of same ? ?Scheduled today ? ?Past Medical History:  ?Diagnosis Date  ? Cancer [facility])   ? PONV (postoperative nausea and vomiting)   ? ? ?Past Surgical History:  ?Procedure Laterality Date  ? ABDOMINAL HYSTERECTOMY    ? LAPAROSCOPIC APPENDECTOMY  01/19/2012  ? Procedure: APPENDECTOMY LAPAROSCOPIC;  Surgeon: Donato Heinz, MD;  Location: [facility];  Service: General;  Laterality: N/A;  ? ? ?Allergies: ?Patient has no known allergies. ? ?Medications: ?Prior to Admission medications   ?Medication Sig Start Date End Date Taking? Authorizing Provider  ?acetaminophen (TYLENOL) 325 MG tablet Take 650 mg by mouth every 6 (six) hours as needed.    [provider]  ?cholecalciferol (VITAMIN D3) 25 MCG (1000 UNIT) tablet Take 1,000 Units by mouth daily.    [provider]  ?omeprazole (PRILOSEC) 40 MG capsule Take 40 mg by mouth daily.    [provider]  ?rosuvastatin (CRESTOR) 20 MG tablet Take 20 mg by mouth at bedtime. ?Patient not taking: Reported on 04/05/2021 04/27/20   [provider]  ?rosuvastatin (CRESTOR) 40 MG tablet Take 40 mg by mouth daily. 10/30/20   [provider]  ?  ? ?Family  History  ?Problem Relation Age of Onset  ? Breast cancer Maternal Aunt   ? ? ?Social History  ? ?Socioeconomic History  ? Marital status: Married  ?  Spouse name: Not on file  ? Number of children: Not on file  ? Years of education: Not on file  ? Highest education level: Not on file  ?Occupational History  ? Not on file  ?Tobacco Use  ? Smoking status: Every Day  ?  Packs/day: 0.50  ?  Types: Cigarettes  ? Smokeless tobacco: Not on file  ?Substance and Sexual Activity  ? Alcohol use: Yes  ?  Comment: occ  ? Drug use: Not on file  ? Sexual activity: Not on file  ?Other Topics Concern  ? Not on file  ?Social History Narrative  ? Not on file  ? ?Social Determinants of Health  ? ?Financial Resource Strain: Not on file  ?Food Insecurity: Not on file  ?Transportation Needs: Not on file  ?Physical Activity: Not on file  ?Stress: Not on file  ?Social Connections: Not on file  ? ? ?Review of Systems: A 12 point ROS discussed and pertinent positives are indicated in the HPI above.  All other systems are negative. ? ?Review of Systems  ?Constitutional:  Negative for activity change, fatigue and fever.  ?Respiratory:  Negative for cough and shortness of breath.   ?Cardiovascular:  Negative for chest pain.  ?Gastrointestinal:  Negative for abdominal pain.  ?Genitourinary:  Positive for flank pain.  ?Neurological:  Negative for weakness.  ?Psychiatric/Behavioral:  Negative for behavioral problems and confusion.   ? ?Vital Signs: ?BP (!) 155/79   Pulse 82   Temp 98 ?F (36.7 ?C) (Oral)   Resp 17   Ht '5\' 2"'$  (1.575 m)   Wt 150 lb (68 kg)   SpO2 96%   BMI 27.44 kg/m?  ? ?Physical Exam ?Vitals reviewed.  ?HENT:  ?   Mouth/Throat:  ?   Mouth: Mucous membranes are moist.  ?Cardiovascular:  ?   Rate and Rhythm: Normal rate and regular rhythm.  ?   Heart sounds: Normal heart sounds.  ?Pulmonary:  ?   Effort: Pulmonary effort is normal.  ?   Breath sounds: Normal breath sounds.  ?Abdominal:  ?   Palpations: Abdomen is soft.  ?    Tenderness: There is right CVA tenderness.  ?Musculoskeletal:     ?   General: Normal range of motion.  ?Skin: ?   General: Skin is warm.  ?Neurological:  ?   Mental Status: She is alert and oriented to person, place, and time.  ?Psychiatric:     ?   Behavior: Behavior normal.  ? ? ?Imaging: ?No results found. ? ?Labs: ? ?CBC: ?No results for input(s): WBC, HGB, HCT, PLT in the last 8760 hours. ? ?COAGS: ?No results for input(s): INR, APTT in the last 8760 hours. ? ?BMP: ?Recent Labs  ?  09/22/20 ?1505  ?CREATININE 0.80  ? ? ?LIVER FUNCTION TESTS: ?No results for input(s): BILITOT, AST, ALT, ALKPHOS, PROT, ALBUMIN in the last 8760 hours. ? ?TUMOR MARKERS: ?No results for input(s): AFPTM, CEA, CA199, CHROMGRNA in the last 8760 hours. ? ?Assessment and Plan: ? ?Right flank pain over 1 yr ?Worsening for few months ?Imaging revealing large Rt renal cyst ?Scheduled for aspiration per Urology ?Risks and benefits of right renal cyst aspiration was discussed with the patient and/or patient's family including, but not limited to bleeding, infection, damage to adjacent structures or low yield requiring additional tests. ? ?All of the questions were answered and there is agreement to proceed. ?Consent signed and in chart.  ? ?Thank you for this interesting consult.  I greatly enjoyed meeting Candice Landry and look forward to participating in their care.  A copy of this report was sent to the requesting provider on this date. ? ?Electronically Signed: ?Lavonia Drafts, PA-C ?04/20/2021, 9:04 AM ? ? ?I spent a total of  30 Minutes   in face to face in clinical consultation, greater than 50% of which was counseling/coordinating care for right renal cyst aspiration  ?

## 2021-04-21 LAB — CYTOLOGY - NON PAP

## 2021-04-27 ENCOUNTER — Telehealth: Payer: Self-pay

## 2021-04-27 NOTE — Telephone Encounter (Signed)
Patient calling for IR results on the cyst aspiration.  ?

## 2021-04-28 NOTE — Telephone Encounter (Signed)
Patient called and notified and voiced understanding of results.  ?

## 2021-07-27 DIAGNOSIS — E559 Vitamin D deficiency, unspecified: Secondary | ICD-10-CM | POA: Diagnosis not present

## 2021-07-27 DIAGNOSIS — E782 Mixed hyperlipidemia: Secondary | ICD-10-CM | POA: Diagnosis not present

## 2021-08-03 DIAGNOSIS — E559 Vitamin D deficiency, unspecified: Secondary | ICD-10-CM | POA: Diagnosis not present

## 2021-08-03 DIAGNOSIS — E039 Hypothyroidism, unspecified: Secondary | ICD-10-CM | POA: Diagnosis not present

## 2021-08-03 DIAGNOSIS — Z6828 Body mass index (BMI) 28.0-28.9, adult: Secondary | ICD-10-CM | POA: Diagnosis not present

## 2021-08-03 DIAGNOSIS — K7689 Other specified diseases of liver: Secondary | ICD-10-CM | POA: Diagnosis not present

## 2021-08-03 DIAGNOSIS — E782 Mixed hyperlipidemia: Secondary | ICD-10-CM | POA: Diagnosis not present

## 2021-08-03 DIAGNOSIS — K219 Gastro-esophageal reflux disease without esophagitis: Secondary | ICD-10-CM | POA: Diagnosis not present

## 2021-08-03 DIAGNOSIS — E663 Overweight: Secondary | ICD-10-CM | POA: Diagnosis not present

## 2021-10-01 DIAGNOSIS — Z23 Encounter for immunization: Secondary | ICD-10-CM | POA: Diagnosis not present

## 2022-01-10 IMAGING — MR MR ABDOMEN WO/W CM
21 series · 48 of 48 positions shown · IV contrast (7 ml Gadavist)
Comparison: No prior abdominal MRI. CT the abdomen and pelvis
09/22/2020.

CLINICAL DATA: 75-year-old female with history of indeterminate
renal cyst. Follow-up study.

EXAM:
MRI ABDOMEN WITHOUT AND WITH CONTRAST
TECHNIQUE: Multiplanar multisequence MR imaging of the abdomen was performed
both before and after the administration of intravenous contrast.
CONTRAST:  10mL GADAVIST GADOBUTROL 1 MMOL/ML IV SOLN

[Series 3: cor haste · coronal · 6.0mm · 1.19mm/px · 2 of 30 slices shown]
[im 1/30]
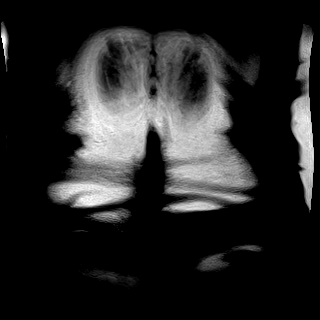
[im 30/30]
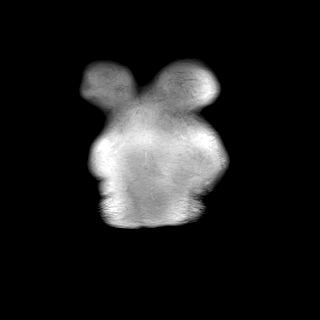

[Series 4: ax haste · axial · 6.0mm · 1.19mm/px · 1 of 30 slices shown]
[im 1/30]
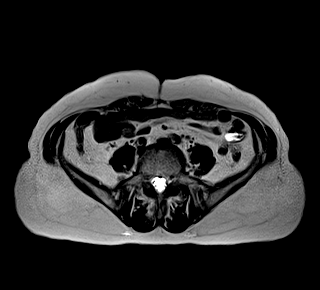

[Series 5: T2 fat-sat · axial · 6.0mm · 1.19mm/px · 1 of 30 slices shown]
[im 1/30]
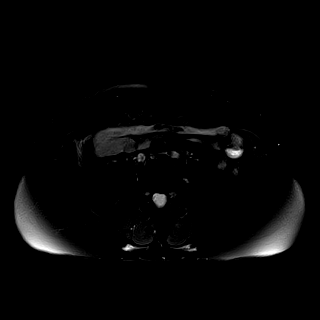

[Series 6: ax in and · axial · 3.5mm · 1.31mm/px · z∈[-370,-121]mm · 3 of 72 slices shown (1 of 2)]
[im 1/72]
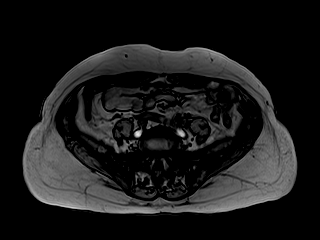
[im 36/72]
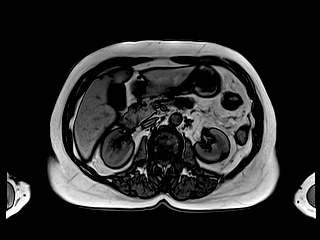
[im 72/72]
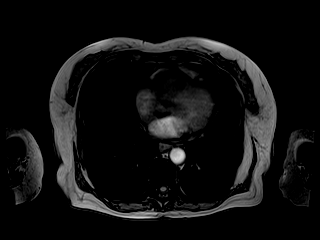

[Series 7: ax in and · axial · 3.5mm · 1.31mm/px · z∈[-370,-121]mm · 3 of 72 slices shown (2 of 2)]
[im 1/72]
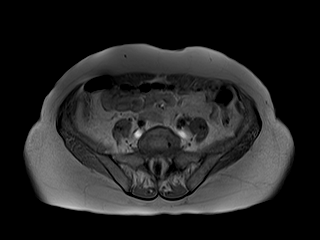
[im 36/72]
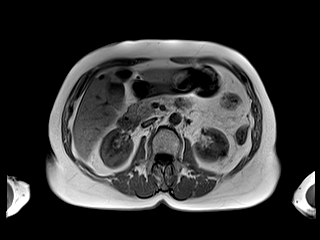
[im 72/72]
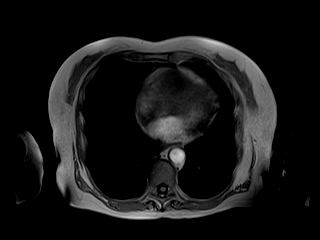

[Series 8: DWI · axial · 6.0mm · 1.42mm/px · 1 of 30 slices shown (1 of 4)]
[im 1/30]
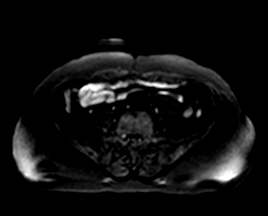

[Series 8: DWI · axial · 6.0mm · 1.42mm/px · 1 of 30 slices shown (2 of 4)]
[im 1/30]
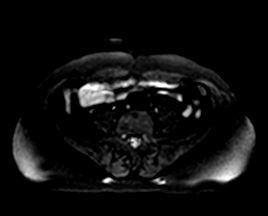

[Series 8: DWI · axial · 6.0mm · 1.42mm/px · 1 of 30 slices shown (3 of 4)]
[im 1/30]
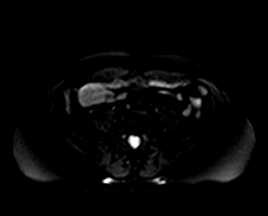

[Series 9: DWI · axial · 6.0mm · 1.42mm/px · 1 of 30 slices shown (4 of 4)]
[im 1/30]
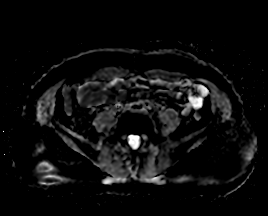

[Series 10: bSSFP · axial · 6.0mm · 0.74mm/px · 1 of 30 slices shown]
[im 1/30]
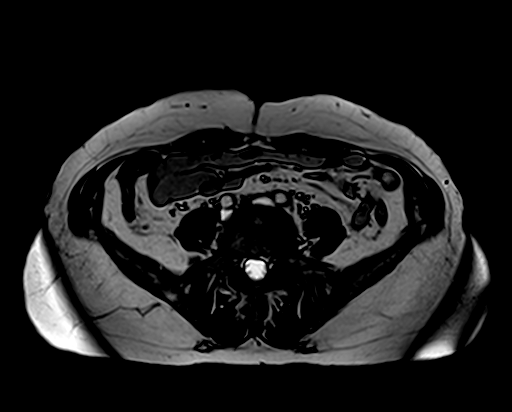

[Series 11: t1_vibe_fs_tra_p4_bh_pre · axial · 3.0mm · 1.34mm/px · z∈[-352,-139]mm · 3 of 72 slices shown]
[im 1/72]
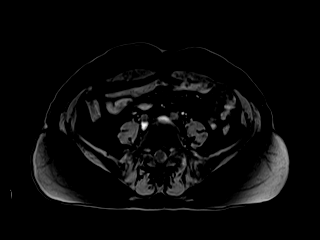
[im 36/72]
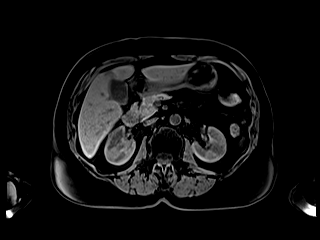
[im 72/72]
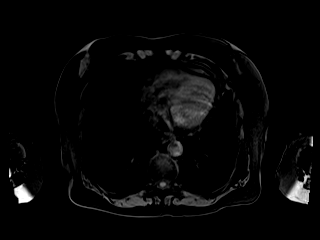

[Series 13: t1_vibe_fs_tra_p4_bh_post · axial · 3.0mm · 1.34mm/px · z∈[-352,-139]mm · 3 of 72 slices shown (1 of 5)]
[im 1/72]
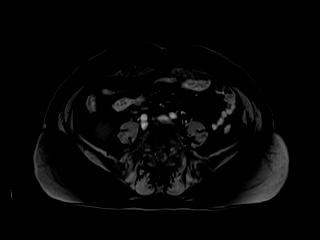
[im 36/72]
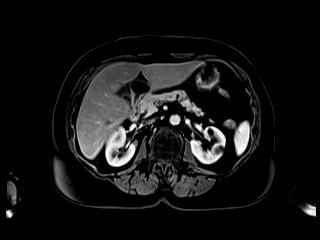
[im 72/72]
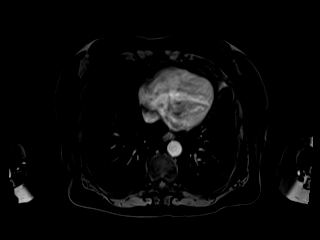

[Series 14: t1_vibe_fs_tra_p4_bh_post_sub · axial · 3.0mm · 1.34mm/px · z∈[-352,-139]mm · 3 of 72 slices shown (1 of 4)]
[im 1/72]
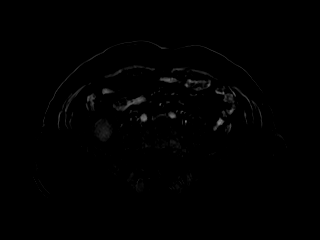
[im 36/72]
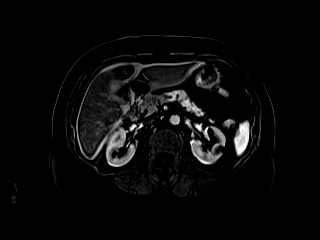
[im 72/72]
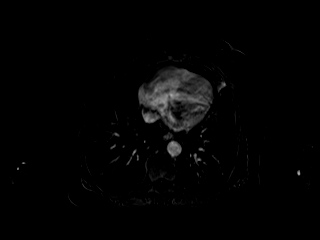

[Series 15: t1_vibe_fs_tra_p4_bh_post · axial · 3.0mm · 1.34mm/px · z∈[-352,-139]mm · 3 of 72 slices shown (2 of 5)]
[im 1/72]
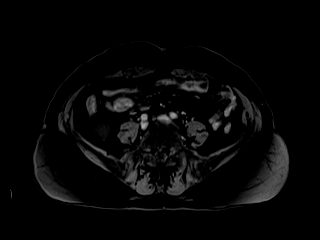
[im 36/72]
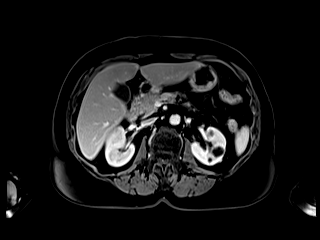
[im 72/72]
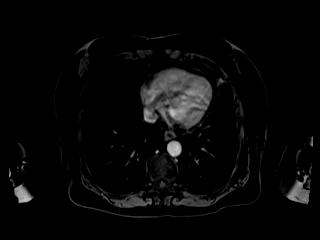

[Series 16: t1_vibe_fs_tra_p4_bh_post_sub · axial · 3.0mm · 1.34mm/px · z∈[-352,-139]mm · 3 of 72 slices shown (2 of 4)]
[im 1/72]
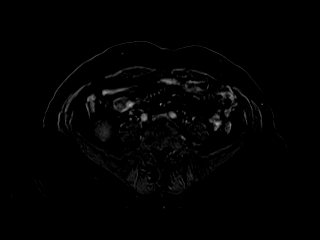
[im 36/72]
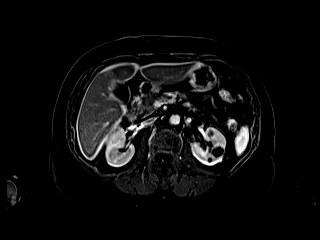
[im 72/72]
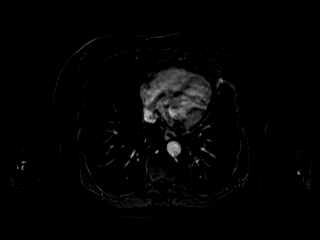

[Series 17: t1_vibe_fs_tra_p4_bh_post · axial · 3.0mm · 1.34mm/px · z∈[-352,-139]mm · 3 of 72 slices shown (3 of 5)]
[im 1/72]
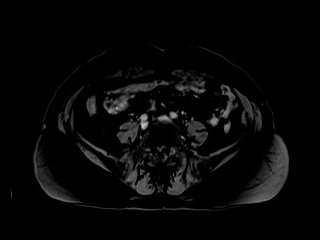
[im 36/72]
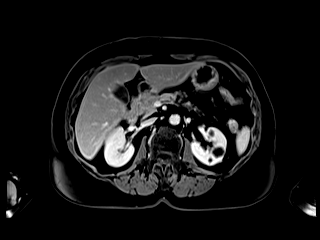
[im 72/72]
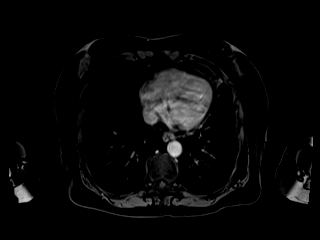

[Series 18: t1_vibe_fs_tra_p4_bh_post_sub · axial · 3.0mm · 1.34mm/px · z∈[-352,-139]mm · 3 of 72 slices shown (3 of 4)]
[im 1/72]
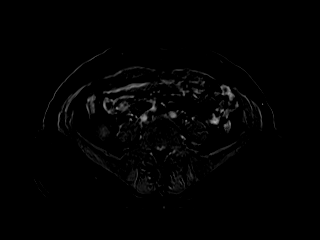
[im 36/72]
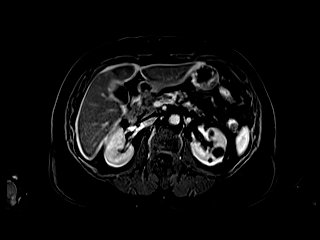
[im 72/72]
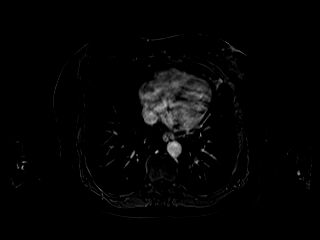

[Series 19: t1_vibe_fs_tra_p4_bh_post · axial · 3.0mm · 1.34mm/px · z∈[-352,-139]mm · 3 of 72 slices shown (4 of 5)]
[im 1/72]
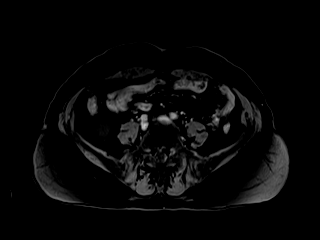
[im 36/72]
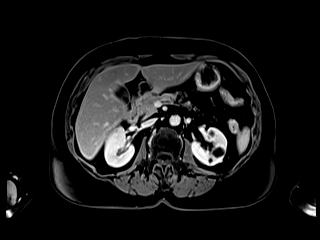
[im 72/72]
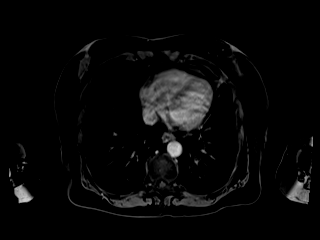

[Series 20: t1_vibe_fs_tra_p4_bh_post_sub · axial · 3.0mm · 1.34mm/px · z∈[-352,-139]mm · 3 of 72 slices shown (4 of 4)]
[im 1/72]
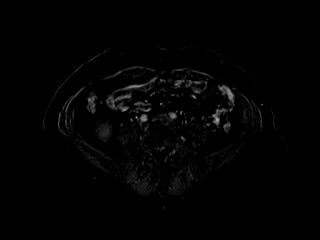
[im 36/72]
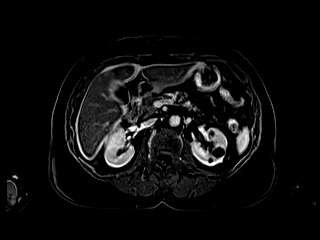
[im 72/72]
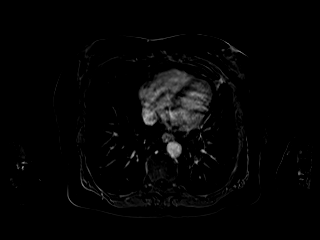

[Series 21: T1 dynamic post-contrast · coronal · 3.0mm · 1.31mm/px · 3 of 72 slices shown]
[im 1/72]
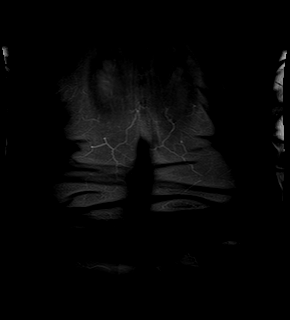
[im 36/72]
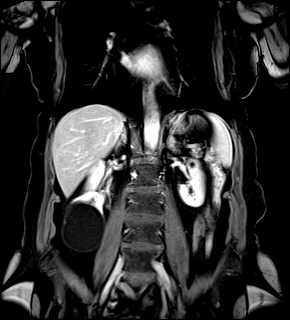
[im 72/72]
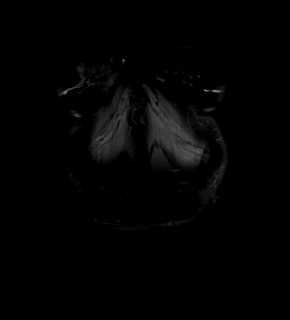

[Series 22: t1_vibe_fs_tra_p4_bh_post · axial · 3.0mm · 1.34mm/px · z∈[-373,-160]mm · 3 of 72 slices shown (5 of 5)]
[im 1/72]
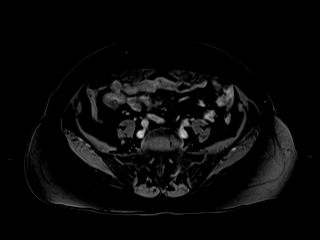
[im 36/72]
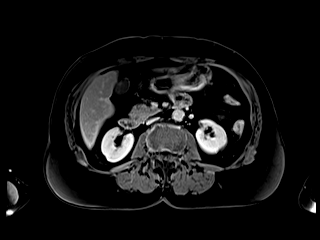
[im 72/72]
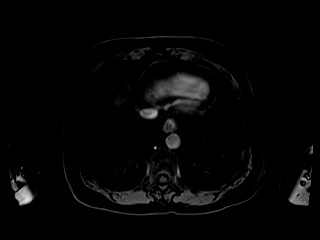

[48 of 48 positions shown; findings below may reference images not displayed]

FINDINGS: Lower chest: Unremarkable.

Hepatobiliary: There are multiple T1 hypointense, T2 hyperintense,
nonenhancing liver lesions, compatible with simple cysts, largest of
which is in the periphery of segment 8 (axial image 6 of series 4)
measuring 1.6 cm in diameter. No other suspicious appearing hepatic
lesions. No intra or extrahepatic biliary ductal dilatation.
Gallbladder is normal in appearance.

Pancreas: No pancreatic mass. No pancreatic ductal dilatation. No
pancreatic or peripancreatic fluid collections or inflammatory
changes.

Spleen:  Unremarkable.

Adrenals/Urinary Tract: In the kidneys bilaterally there are
multiple T1 hypointense, T2 hyperintense, nonenhancing lesions
compatible with simple cysts, largest of which is an exophytic
lesion in the lower pole of the right kidney measuring 7.0 x 6.1 x
7.0 cm. In addition, in the posterior aspect of the interpolar
region of the left kidney ren sees axial image 34 of series 11)
there is a T1 hyperintense, T2 hypointense lesion which does not
enhance, compatible with a small proteinaceous/hemorrhagic cyst. No
other suspicious renal lesions are noted. No hydroureteronephrosis
in the visualized portions of the abdomen. Bilateral adrenal glands
are normal in appearance.

Stomach/Bowel: Visualized portions are unremarkable.

Vascular/Lymphatic: No aneurysm identified in the visualized
abdominal vasculature. No lymphadenopathy noted in the abdomen.

Other: No significant volume of ascites noted in the visualized
portions of the peritoneal cavity.

Musculoskeletal: No aggressive appearing osseous lesions are noted
in the visualized portions of the skeleton.
IMPRESSION: 1. Multiple Bosniak class 1 and Bosniak class 2 cysts in the kidneys
bilaterally, as above. The largest of these is a exophytic simple
cyst in the lower pole of the right kidney measuring 7.0 x 6.1 x
cm.
2. Multiple small hepatic cysts also incidentally noted.

## 2022-02-03 DIAGNOSIS — E782 Mixed hyperlipidemia: Secondary | ICD-10-CM | POA: Diagnosis not present

## 2022-02-09 ENCOUNTER — Other Ambulatory Visit (HOSPITAL_COMMUNITY): Payer: Self-pay | Admitting: Family Medicine

## 2022-02-09 DIAGNOSIS — Z1231 Encounter for screening mammogram for malignant neoplasm of breast: Secondary | ICD-10-CM

## 2022-02-09 DIAGNOSIS — E782 Mixed hyperlipidemia: Secondary | ICD-10-CM | POA: Diagnosis not present

## 2022-02-09 DIAGNOSIS — Z0000 Encounter for general adult medical examination without abnormal findings: Secondary | ICD-10-CM | POA: Diagnosis not present

## 2022-02-09 DIAGNOSIS — R079 Chest pain, unspecified: Secondary | ICD-10-CM | POA: Diagnosis not present

## 2022-02-09 DIAGNOSIS — K7689 Other specified diseases of liver: Secondary | ICD-10-CM

## 2022-02-09 DIAGNOSIS — E559 Vitamin D deficiency, unspecified: Secondary | ICD-10-CM | POA: Diagnosis not present

## 2022-02-09 DIAGNOSIS — K219 Gastro-esophageal reflux disease without esophagitis: Secondary | ICD-10-CM | POA: Diagnosis not present

## 2022-02-14 ENCOUNTER — Ambulatory Visit (HOSPITAL_COMMUNITY)
Admission: RE | Admit: 2022-02-14 | Discharge: 2022-02-14 | Disposition: A | Discharge status: Home / Self Care | Payer: 59 | Source: Ambulatory Visit | Attending: Family Medicine | Admitting: Family Medicine

## 2022-02-14 DIAGNOSIS — Z1231 Encounter for screening mammogram for malignant neoplasm of breast: Secondary | ICD-10-CM | POA: Diagnosis not present

## 2022-03-24 ENCOUNTER — Ambulatory Visit (HOSPITAL_COMMUNITY)
Admission: RE | Admit: 2022-03-24 | Discharge: 2022-03-24 | Disposition: A | Discharge status: Home / Self Care | Payer: 59 | Source: Ambulatory Visit | Attending: Family Medicine | Admitting: Family Medicine

## 2022-03-24 DIAGNOSIS — K7689 Other specified diseases of liver: Secondary | ICD-10-CM | POA: Diagnosis not present

## 2022-03-24 DIAGNOSIS — N281 Cyst of kidney, acquired: Secondary | ICD-10-CM | POA: Diagnosis not present

## 2022-03-24 MED ORDER — IOHEXOL 300 MG/ML  SOLN
100.0000 mL | Freq: Once | INTRAMUSCULAR | Status: AC | PRN
  Administered 2022-03-24: 100 mL via INTRAVENOUS

## 2022-08-10 DIAGNOSIS — E782 Mixed hyperlipidemia: Secondary | ICD-10-CM | POA: Diagnosis not present

## 2022-08-17 ENCOUNTER — Other Ambulatory Visit (HOSPITAL_COMMUNITY): Payer: Self-pay | Admitting: Family Medicine

## 2022-08-17 DIAGNOSIS — D72829 Elevated white blood cell count, unspecified: Secondary | ICD-10-CM | POA: Diagnosis not present

## 2022-08-17 DIAGNOSIS — R030 Elevated blood-pressure reading, without diagnosis of hypertension: Secondary | ICD-10-CM | POA: Diagnosis not present

## 2022-08-17 DIAGNOSIS — M810 Age-related osteoporosis without current pathological fracture: Secondary | ICD-10-CM | POA: Diagnosis not present

## 2022-08-17 DIAGNOSIS — R42 Dizziness and giddiness: Secondary | ICD-10-CM | POA: Diagnosis not present

## 2022-08-17 DIAGNOSIS — R519 Headache, unspecified: Secondary | ICD-10-CM | POA: Diagnosis not present

## 2022-08-17 DIAGNOSIS — K219 Gastro-esophageal reflux disease without esophagitis: Secondary | ICD-10-CM | POA: Diagnosis not present

## 2022-08-17 DIAGNOSIS — E039 Hypothyroidism, unspecified: Secondary | ICD-10-CM | POA: Diagnosis not present

## 2022-08-17 DIAGNOSIS — R079 Chest pain, unspecified: Secondary | ICD-10-CM | POA: Diagnosis not present

## 2022-08-17 DIAGNOSIS — E782 Mixed hyperlipidemia: Secondary | ICD-10-CM | POA: Diagnosis not present

## 2022-08-17 DIAGNOSIS — N281 Cyst of kidney, acquired: Secondary | ICD-10-CM | POA: Diagnosis not present

## 2022-08-17 DIAGNOSIS — E559 Vitamin D deficiency, unspecified: Secondary | ICD-10-CM | POA: Diagnosis not present

## 2022-08-17 DIAGNOSIS — K7689 Other specified diseases of liver: Secondary | ICD-10-CM | POA: Diagnosis not present

## 2022-08-26 ENCOUNTER — Ambulatory Visit (HOSPITAL_COMMUNITY)
Admission: RE | Admit: 2022-08-26 | Discharge: 2022-08-26 | Disposition: A | Discharge status: Home / Self Care | Payer: 59 | Source: Ambulatory Visit | Attending: Family Medicine | Admitting: Family Medicine

## 2022-08-26 DIAGNOSIS — M810 Age-related osteoporosis without current pathological fracture: Secondary | ICD-10-CM | POA: Insufficient documentation

## 2022-10-11 DIAGNOSIS — Z23 Encounter for immunization: Secondary | ICD-10-CM | POA: Diagnosis not present

## 2022-10-26 ENCOUNTER — Ambulatory Visit: Payer: 59 | Attending: Internal Medicine | Admitting: Internal Medicine

## 2022-10-26 ENCOUNTER — Encounter: Payer: Self-pay | Admitting: Internal Medicine

## 2022-10-26 VITALS — BP 170/102 | HR 83 | Ht 63.5 in | Wt 160.0 lb

## 2022-10-26 DIAGNOSIS — R0789 Other chest pain: Secondary | ICD-10-CM | POA: Diagnosis not present

## 2022-10-26 DIAGNOSIS — R079 Chest pain, unspecified: Secondary | ICD-10-CM

## 2022-10-26 NOTE — Patient Instructions (Signed)
Medication Instructions:  Your physician recommends that you continue on your current medications as directed. Please refer to the Current Medication list given to you today.  *If you need a refill on your cardiac medications before your next appointment, please call your pharmacy*   Lab Work: None If you have labs (blood work) drawn today and your tests are completely normal, you will receive your results only by: MyChart Message (if you have MyChart) OR A paper copy in the mail If you have any lab test that is abnormal or we need to change your treatment, we will call you to review the results.   Testing/Procedures: None   Follow-Up: At [facility], you and your health needs are our priority.  As part of our continuing mission to provide you with exceptional heart care, we have created designated Provider Care Teams.  These Care Teams include your primary Cardiologist (physician) and Advanced Practice Providers (APPs -  Physician Assistants and Nurse Practitioners) who all work together to provide you with the care you need, when you need it.  We recommend signing up for the patient portal called "MyChart".  Sign up information is provided on this After Visit Summary.  MyChart is used to connect with patients for Virtual Visits (Telemedicine).  Patients are able to view lab/test results, encounter notes, upcoming appointments, etc.  Non-urgent messages can be sent to your provider as well.   To learn more about what you can do with MyChart, go to [email or web address].    Your next appointment:    Follow up as needed.   Provider:   Luane School, MD    Other Instructions

## 2022-10-28 DIAGNOSIS — R0789 Other chest pain: Secondary | ICD-10-CM | POA: Insufficient documentation

## 2022-10-28 NOTE — Progress Notes (Signed)
Cardiology Office Note  Date: 10/28/2022   ID: Candice Landry, DOB Jul 30, 1945, MRN [id number]  PCP:  Benita Stabile, MD  Cardiologist:  None Electrophysiologist:  None   History of Present Illness: Candice Landry is a 77 y.o. female with no significant PMH was referred to cardiology clinic for evaluation of chest pains.  Patient reported having 4 episodes of chest pain in the last 6 months.  This chest pains occur mainly when she lays on her back in bed.  She also reports these chest pains could likely be secondary to GERD.  Currently on good medications.  No chest pain with exertion at all.  She performs her household activities with no symptoms of angina.  No DOE, orthopnea, PND, leg swelling, dizziness, lightheadedness, syncope.  Past Medical History:  Diagnosis Date   Cancer (HCC)    PONV (postoperative nausea and vomiting)     Past Surgical History:  Procedure Laterality Date   ABDOMINAL HYSTERECTOMY     LAPAROSCOPIC APPENDECTOMY  01/19/2012   Procedure: APPENDECTOMY LAPAROSCOPIC;  Surgeon: Fabio Bering, MD;  Location: [facility];  Service: General;  Laterality: N/A;    Current Outpatient Medications  Medication Sig Dispense Refill   acetaminophen (TYLENOL) 325 MG tablet Take 650 mg by mouth every 6 (six) hours as needed.     aspirin EC 325 MG tablet Take 325 mg by mouth daily.     bisacodyl (DULCOLAX) 5 MG EC tablet Take 5 mg by mouth daily as needed for moderate constipation.     calcium carbonate (TUMS EX) 750 MG chewable tablet Chew 1 tablet by mouth daily.     cholecalciferol (VITAMIN D3) 25 MCG (1000 UNIT) tablet Take 1,000 Units by mouth daily.     co-enzyme Q-10 30 MG capsule Take 30 mg by mouth 3 (three) times daily.     cyanocobalamin (VITAMIN B12) 1000 MCG tablet Take 3,000 mcg by mouth daily.     ibuprofen (ADVIL) 200 MG tablet Take 200 mg by mouth every 6 (six) hours as needed for mild pain (pain score 1-3).     lansoprazole (PREVACID) 30 MG capsule Take 30 mg by  mouth daily at 12 noon.     magnesium 30 MG tablet Take 100 mg by mouth daily.     omeprazole (PRILOSEC) 40 MG capsule Take 40 mg by mouth daily.     rosuvastatin (CRESTOR) 40 MG tablet Take 40 mg by mouth daily.     vitamin E 180 MG (400 UNITS) capsule Take 400 Units by mouth daily.     rosuvastatin (CRESTOR) 20 MG tablet Take 20 mg by mouth at bedtime.     No current facility-administered medications for this visit.   Allergies:  Patient has no known allergies.   Social History: The patient  reports that she has been smoking cigarettes. She does not have any smokeless tobacco history on file. She reports current alcohol use.   Family History: The patient's family history includes Breast cancer in her maternal aunt.   ROS:  Please see the history of present illness. Otherwise, complete review of systems is positive for none.  All other systems are reviewed and negative.   Physical Exam: VS:  BP (!) 170/102   Pulse 83   Ht 5' 3.5" (1.613 m)   Wt 160 lb (72.6 kg)   SpO2 96%   BMI 27.90 kg/m , BMI Body mass index is 27.9 kg/m.  Wt Readings from Last 3 Encounters:  10/26/22 160  lb (72.6 kg)  04/20/21 150 lb (68 kg)  10/07/20 155 lb 14.4 oz (70.7 kg)    General: Patient appears comfortable at rest. HEENT: Conjunctiva and lids normal, oropharynx clear with moist mucosa. Neck: Supple, no elevated JVP or carotid bruits, no thyromegaly. Lungs: Clear to auscultation, nonlabored breathing at rest. Cardiac: Regular rate and rhythm, no S3 or significant systolic murmur, no pericardial rub. Abdomen: Soft, nontender, no hepatomegaly, bowel sounds present, no guarding or rebound. Extremities: No pitting edema, distal pulses 2+. Skin: Warm and dry. Musculoskeletal: No kyphosis. Neuropsychiatric: Alert and oriented x3, affect grossly appropriate.  Recent Labwork: No results found for requested labs within last 365 days.  No results found for: "CHOL", "TRIG", "HDL", "CHOLHDL", "VLDL",  "LDLCALC", "LDLDIRECT"   Assessment and Plan:  Noncardiac chest pain: Chest pain occurred 4 times in the last 6 months, occurs at rest especially after she lays on her back in bed consistent with GERD.  No chest pain with exertion.  Her chest pains are likely noncardiac, secondary to GERD, follow-up with PCP.  No further cardiac testing indicated at this time.  I spent a total duration 30 minutes reviewing the notes, labs, face-to-face discussion/counseling of the patient's medical condition, pathophysiology, evaluation, management and documenting the findings in the note.   Medication Adjustments/Labs and Tests Ordered: Current medicines are reviewed at length with the patient today.  Concerns regarding medicines are outlined above.    Disposition:  Follow up as needed  Signed, Chaunce Winkels Verne Spurr, MD, 10/28/2022 5:36 PM    [facility] [address], [place], [place] [address]

## 2023-02-13 DIAGNOSIS — E785 Hyperlipidemia, unspecified: Secondary | ICD-10-CM | POA: Diagnosis not present

## 2023-03-08 ENCOUNTER — Other Ambulatory Visit (HOSPITAL_COMMUNITY): Payer: Self-pay | Admitting: Nurse Practitioner

## 2023-03-08 DIAGNOSIS — K219 Gastro-esophageal reflux disease without esophagitis: Secondary | ICD-10-CM | POA: Diagnosis not present

## 2023-03-08 DIAGNOSIS — N281 Cyst of kidney, acquired: Secondary | ICD-10-CM | POA: Diagnosis not present

## 2023-03-08 DIAGNOSIS — E039 Hypothyroidism, unspecified: Secondary | ICD-10-CM | POA: Diagnosis not present

## 2023-03-08 DIAGNOSIS — E782 Mixed hyperlipidemia: Secondary | ICD-10-CM | POA: Diagnosis not present

## 2023-03-08 DIAGNOSIS — E559 Vitamin D deficiency, unspecified: Secondary | ICD-10-CM | POA: Diagnosis not present

## 2023-03-08 DIAGNOSIS — K7689 Other specified diseases of liver: Secondary | ICD-10-CM | POA: Diagnosis not present

## 2023-03-08 DIAGNOSIS — R519 Headache, unspecified: Secondary | ICD-10-CM | POA: Diagnosis not present

## 2023-03-08 DIAGNOSIS — R030 Elevated blood-pressure reading, without diagnosis of hypertension: Secondary | ICD-10-CM | POA: Diagnosis not present

## 2023-03-08 DIAGNOSIS — Z1231 Encounter for screening mammogram for malignant neoplasm of breast: Secondary | ICD-10-CM

## 2023-03-08 DIAGNOSIS — D72829 Elevated white blood cell count, unspecified: Secondary | ICD-10-CM | POA: Diagnosis not present

## 2023-03-08 DIAGNOSIS — R079 Chest pain, unspecified: Secondary | ICD-10-CM | POA: Diagnosis not present

## 2023-03-13 ENCOUNTER — Other Ambulatory Visit (HOSPITAL_COMMUNITY): Payer: 59

## 2023-03-15 ENCOUNTER — Encounter (HOSPITAL_COMMUNITY): Payer: Self-pay

## 2023-03-15 ENCOUNTER — Ambulatory Visit (HOSPITAL_COMMUNITY)
Admission: RE | Admit: 2023-03-15 | Discharge: 2023-03-15 | Disposition: A | Discharge status: Home / Self Care | Payer: 59 | Source: Ambulatory Visit | Attending: Nurse Practitioner | Admitting: Nurse Practitioner

## 2023-03-15 DIAGNOSIS — E039 Hypothyroidism, unspecified: Secondary | ICD-10-CM | POA: Diagnosis present

## 2023-03-15 DIAGNOSIS — Z1231 Encounter for screening mammogram for malignant neoplasm of breast: Secondary | ICD-10-CM | POA: Diagnosis present

## 2023-03-21 ENCOUNTER — Other Ambulatory Visit (HOSPITAL_COMMUNITY): Payer: Self-pay | Admitting: Nurse Practitioner

## 2023-03-21 DIAGNOSIS — R928 Other abnormal and inconclusive findings on diagnostic imaging of breast: Secondary | ICD-10-CM

## 2023-03-23 ENCOUNTER — Ambulatory Visit (HOSPITAL_COMMUNITY)
Admission: RE | Admit: 2023-03-23 | Discharge: 2023-03-23 | Disposition: A | Discharge status: Home / Self Care | Source: Ambulatory Visit | Attending: Nurse Practitioner | Admitting: Nurse Practitioner

## 2023-03-23 ENCOUNTER — Encounter (HOSPITAL_COMMUNITY): Payer: Self-pay

## 2023-03-23 DIAGNOSIS — R928 Other abnormal and inconclusive findings on diagnostic imaging of breast: Secondary | ICD-10-CM | POA: Diagnosis present

## 2023-06-15 ENCOUNTER — Other Ambulatory Visit (HOSPITAL_COMMUNITY): Payer: Self-pay | Admitting: Nurse Practitioner

## 2023-06-15 DIAGNOSIS — K7689 Other specified diseases of liver: Secondary | ICD-10-CM

## 2023-07-19 ENCOUNTER — Encounter (HOSPITAL_COMMUNITY): Payer: Self-pay | Admitting: Radiology

## 2023-07-19 ENCOUNTER — Ambulatory Visit (HOSPITAL_COMMUNITY)
Admission: RE | Admit: 2023-07-19 | Discharge: 2023-07-19 | Disposition: A | Discharge status: Home / Self Care | Source: Ambulatory Visit | Attending: Nurse Practitioner | Admitting: Nurse Practitioner

## 2023-07-19 DIAGNOSIS — K7689 Other specified diseases of liver: Secondary | ICD-10-CM | POA: Insufficient documentation

## 2023-07-19 LAB — POCT I-STAT CREATININE: Creatinine, Ser: 1 mg/dL (ref 0.44–1.00)

## 2023-07-19 MED ORDER — IOHEXOL 300 MG/ML  SOLN
80.0000 mL | Freq: Once | INTRAMUSCULAR | Status: AC | PRN
Start: 2023-07-19 — End: 2023-07-19
  Administered 2023-07-19: 80 mL via INTRAVENOUS

## 2023-10-13 ENCOUNTER — Inpatient Hospital Stay

## 2023-10-13 ENCOUNTER — Encounter: Payer: Self-pay | Admitting: Hematology and Oncology

## 2023-10-13 ENCOUNTER — Inpatient Hospital Stay: Attending: Hematology and Oncology | Admitting: Hematology and Oncology

## 2023-10-13 ENCOUNTER — Other Ambulatory Visit: Payer: Self-pay

## 2023-10-13 VITALS — BP 143/82 | HR 86 | Temp 97.6°F | Resp 16 | Ht 61.42 in | Wt 157.8 lb

## 2023-10-13 DIAGNOSIS — Z803 Family history of malignant neoplasm of breast: Secondary | ICD-10-CM | POA: Insufficient documentation

## 2023-10-13 DIAGNOSIS — D72820 Lymphocytosis (symptomatic): Secondary | ICD-10-CM

## 2023-10-13 DIAGNOSIS — F17210 Nicotine dependence, cigarettes, uncomplicated: Secondary | ICD-10-CM | POA: Diagnosis not present

## 2023-10-13 LAB — CBC WITH DIFFERENTIAL/PLATELET
Abs Immature Granulocytes: 0.04 K/uL (ref 0.00–0.07)
Basophils Absolute: 0.1 K/uL (ref 0.0–0.1)
Basophils Relative: 1 %
Eosinophils Absolute: 0.2 K/uL (ref 0.0–0.5)
Eosinophils Relative: 1 %
HCT: 44 % (ref 36.0–46.0)
Hemoglobin: 14.7 g/dL (ref 12.0–15.0)
Immature Granulocytes: 0 %
Lymphocytes Relative: 53 %
Lymphs Abs: 8.2 K/uL — ABNORMAL HIGH (ref 0.7–4.0)
MCH: 31.4 pg (ref 26.0–34.0)
MCHC: 33.4 g/dL (ref 30.0–36.0)
MCV: 94 fL (ref 80.0–100.0)
Monocytes Absolute: 0.5 K/uL (ref 0.1–1.0)
Monocytes Relative: 4 %
Neutro Abs: 6.1 K/uL (ref 1.7–7.7)
Neutrophils Relative %: 41 %
Platelets: 228 K/uL (ref 150–400)
RBC: 4.68 MIL/uL (ref 3.87–5.11)
RDW: 13.5 % (ref 11.5–15.5)
Smear Review: NORMAL
WBC: 15.1 K/uL — ABNORMAL HIGH (ref 4.0–10.5)
nRBC: 0 % (ref 0.0–0.2)

## 2023-10-13 LAB — COMPREHENSIVE METABOLIC PANEL WITH GFR
ALT: 38 U/L (ref 0–44)
AST: 29 U/L (ref 15–41)
Albumin: 4.7 g/dL (ref 3.5–5.0)
Alkaline Phosphatase: 92 U/L (ref 38–126)
Anion gap: 14 (ref 5–15)
BUN: 21 mg/dL (ref 8–23)
CO2: 23 mmol/L (ref 22–32)
Calcium: 10.3 mg/dL (ref 8.9–10.3)
Chloride: 105 mmol/L (ref 98–111)
Creatinine, Ser: 0.98 mg/dL (ref 0.44–1.00)
GFR, Estimated: 58 mL/min — ABNORMAL LOW (ref >60)
Glucose, Bld: 100 mg/dL — ABNORMAL HIGH (ref 70–99)
Potassium: 4.1 mmol/L (ref 3.5–5.1)
Sodium: 142 mmol/L (ref 135–145)
Total Bilirubin: 0.6 mg/dL (ref 0.0–1.2)
Total Protein: 7.5 g/dL (ref 6.5–8.1)

## 2023-10-13 LAB — LACTATE DEHYDROGENASE: LDH: 197 U/L — ABNORMAL HIGH (ref 98–192)

## 2023-10-13 NOTE — Assessment & Plan Note (Signed)
 Lab review: 07/23/2020: WBC 10.1, hemoglobin 15.3, platelets 257, ANC 5.2, ALC 4.2 07/27/2021: WBC 10.7, hemoglobin 14.7, platelets 223, ANC 5.4, ALC 4.5 08/10/2022: WBC 11.9, hemoglobin 14.6, platelets 221, ANC 5, ALC 5.9 02/13/2023: WBC 12.4, hemoglobin 14.4, platelets 202, ANC 4.6, ALC 6.9 06/12/2023: WBC 14.4, hemoglobin 14.5, platelets 211, ANC 5.9, ALC 7.7 09/19/2023: WBC 16.7, hemoglobin 13.8, platelets 193, ANC 6.1, ALC 9.5  Lymphocytosis: Differential diagnosis: Reactive process versus CLL Counseling: I discussed with the patient that she has had a gradual increase in the lymphocyte count over the past 2 to 3 years most certainly suggesting a clonal hematopoietic process like CLL.  I explained that CLL is an indolent condition that could generally take several years before requiring treatment.  In certain patients they may not even need treatment throughout their life.  Plan: Flow cytometry in the peripheral blood FISH panel for CLL Follow-up to discuss the test results No indication to treat based upon current lab findings as well as patient's symptoms.  Indications to repeat CLL: Generally include rapid doubling time, anemia or thrombocytopenia as well as B symptoms or worsening generalized lymphadenopathy.

## 2023-10-13 NOTE — Progress Notes (Signed)
 [facility] CONSULT NOTE  Patient Care Team: Shona Norleen PEDLAR, MD as PCP - General (Internal Medicine)  CHIEF COMPLAINTS/PURPOSE OF CONSULTATION:  Lymphocytosis  HISTORY OF PRESENTING ILLNESS:    History of Present Illness Candice Landry is an 78 year old female who presents with elevated white blood cell count for hematological evaluation. She was referred for evaluation of elevated white blood cell count primarily elevated lymphocytes.  Over the past five years, she has experienced a gradual increase in her white blood cell count, rising from 10.7 to 16.7. The increase is primarily due to elevated lymphocytes, currently at 9.5. Hemoglobin and platelet levels remain within normal limits.  She does not report having any fatigue or weight loss or lymphadenopathy or night sweats.  No recent infections or vaccinations.    I reviewed her records extensively and collaborated the history with the patient.   MEDICAL HISTORY:  Past Medical History:  Diagnosis Date   Cancer (HCC)    PONV (postoperative nausea and vomiting)     SURGICAL HISTORY: Past Surgical History:  Procedure Laterality Date   ABDOMINAL HYSTERECTOMY     LAPAROSCOPIC APPENDECTOMY  01/19/2012   Procedure: APPENDECTOMY LAPAROSCOPIC;  Surgeon: Thresa JAYSON Pulling, MD;  Location: [facility];  Service: General;  Laterality: N/A;    SOCIAL HISTORY: Social History   Socioeconomic History   Marital status: Married    Spouse name: Not on file   Number of children: Not on file   Years of education: Not on file   Highest education level: Not on file  Occupational History   Not on file  Tobacco Use   Smoking status: Every Day    Current packs/day: 0.50    Types: Cigarettes   Smokeless tobacco: Not on file  Substance and Sexual Activity   Alcohol use: Yes    Comment: occ   Drug use: Not on file   Sexual activity: Not on file  Other Topics Concern   Not on file  Social History Narrative   Not on file   Social  Drivers of Health   Financial Resource Strain: Not on file  Food Insecurity: No Food Insecurity (10/13/2023)   Hunger Vital Sign    Worried About Running Out of Food in the Last Year: Never true    Ran Out of Food in the Last Year: Never true  Transportation Needs: No Transportation Needs (10/13/2023)   PRAPARE - Administrator, Civil Service (Medical): No    Lack of Transportation (Non-Medical): No  Physical Activity: Not on file  Stress: Not on file  Social Connections: Not on file  Intimate Partner Violence: Not At Risk (10/13/2023)   Humiliation, Afraid, Rape, and Kick questionnaire    Fear of Current or Ex-Partner: No    Emotionally Abused: No    Physically Abused: No    Sexually Abused: No    FAMILY HISTORY: Family History  Problem Relation Age of Onset   Breast cancer Maternal Aunt     ALLERGIES:  has no known allergies.  MEDICATIONS:  Current Outpatient Medications  Medication Sig Dispense Refill   acetaminophen  (TYLENOL ) 325 MG tablet Take 650 mg by mouth every 6 (six) hours as needed.     bisacodyl (DULCOLAX) 5 MG EC tablet Take 5 mg by mouth daily as needed for moderate constipation.     cholecalciferol (VITAMIN D3) 25 MCG (1000 UNIT) tablet Take 1,000 Units by mouth daily.     co-enzyme Q-10 30 MG capsule  Take 30 mg by mouth 3 (three) times daily. (Patient taking differently: Take 30 mg by mouth daily.)     cyanocobalamin (VITAMIN B12) 1000 MCG tablet Take 3,000 mcg by mouth daily.     ibuprofen (ADVIL) 200 MG tablet Take 200 mg by mouth every 6 (six) hours as needed for mild pain (pain score 1-3).     lansoprazole (PREVACID) 30 MG capsule Take 30 mg by mouth daily at 12 noon.     magnesium 30 MG tablet Take 100 mg by mouth daily.     omeprazole (PRILOSEC) 40 MG capsule Take 40 mg by mouth daily. (Patient taking differently: Take 40 mg by mouth as needed.)     rosuvastatin (CRESTOR) 40 MG tablet Take 40 mg by mouth daily.     aspirin EC 325 MG tablet  Take 325 mg by mouth daily. (Patient not taking: Reported on 10/13/2023)     calcium carbonate (TUMS EX) 750 MG chewable tablet Chew 1 tablet by mouth daily. (Patient not taking: Reported on 10/13/2023)     rosuvastatin (CRESTOR) 20 MG tablet Take 20 mg by mouth at bedtime. (Patient not taking: Reported on 10/13/2023)     vitamin E 180 MG (400 UNITS) capsule Take 400 Units by mouth daily. (Patient not taking: Reported on 10/13/2023)     No current facility-administered medications for this visit.    REVIEW OF SYSTEMS:   Constitutional: Denies fevers, chills or abnormal night sweats All other systems were reviewed with the patient and are negative.  PHYSICAL EXAMINATION: ECOG PERFORMANCE STATUS: 0 - Asymptomatic  Vitals:   10/13/23 1056 10/13/23 1100  BP: (!) 161/83 (!) 143/82  Pulse: 86   Resp: 16   Temp:  97.6 F (36.4 C)  SpO2: 96%    Filed Weights   10/13/23 1056  Weight: 157 lb 13.6 oz (71.6 kg)    GENERAL:alert, no distress and comfortable  LABORATORY DATA:  I have reviewed the data as listed Lab Results  Component Value Date   WBC 10.8 (H) 04/20/2021   HGB 14.0 04/20/2021   HCT 41.5 04/20/2021   MCV 91.0 04/20/2021   PLT 207 04/20/2021   Lab Results  Component Value Date   NA 141 04/20/2021   K 4.4 04/20/2021   CL 110 04/20/2021   CO2 26 04/20/2021    RADIOGRAPHIC STUDIES: I have personally reviewed the radiological reports and agreed with the findings in the report.  ASSESSMENT AND PLAN:  Lymphocytosis Lab review: 07/23/2020: WBC 10.1, hemoglobin 15.3, platelets 257, ANC 5.2, ALC 4.2 07/27/2021: WBC 10.7, hemoglobin 14.7, platelets 223, ANC 5.4, ALC 4.5 08/10/2022: WBC 11.9, hemoglobin 14.6, platelets 221, ANC 5, ALC 5.9 02/13/2023: WBC 12.4, hemoglobin 14.4, platelets 202, ANC 4.6, ALC 6.9 06/12/2023: WBC 14.4, hemoglobin 14.5, platelets 211, ANC 5.9, ALC 7.7 09/19/2023: WBC 16.7, hemoglobin 13.8, platelets 193, ANC 6.1, ALC 9.5  Lymphocytosis: Differential  diagnosis: Reactive process versus CLL Counseling: I discussed with the patient that she has had a gradual increase in the lymphocyte count over the past 2 to 3 years most certainly suggesting a clonal hematopoietic process like CLL.  I explained that CLL is an indolent condition that could generally take several years before requiring treatment and most times may not even require treatment.    Plan: Flow cytometry in the peripheral blood FISH panel for CLL No indication to treat based upon current lab findings as well as patient's symptoms.  Indications to treat CLL if she was confirmed: Generally include rapid  doubling time, anemia or thrombocytopenia as well as B symptoms or worsening generalized lymphadenopathy.  Will arrange a 2-week follow-up to discuss the results of the blood work with our nurse practitioner  All questions were answered. The patient knows to call the clinic with any problems, questions or concerns.    Viinay K Laiken Nohr, MD 10/13/23

## 2023-10-16 LAB — SURGICAL PATHOLOGY

## 2023-10-17 LAB — PATHOLOGIST SMEAR REVIEW

## 2023-10-18 LAB — FLOW CYTOMETRY

## 2023-10-23 LAB — FISH HES LEUKEMIA, 4Q12 REA
Cells Analyzed: 200
Cells Counted:: 200

## 2023-10-31 ENCOUNTER — Ambulatory Visit: Admitting: Oncology

## 2023-11-01 ENCOUNTER — Inpatient Hospital Stay (HOSPITAL_BASED_OUTPATIENT_CLINIC_OR_DEPARTMENT_OTHER): Admitting: Oncology

## 2023-11-01 ENCOUNTER — Encounter: Payer: Self-pay | Admitting: Oncology

## 2023-11-01 VITALS — BP 139/78 | HR 71 | Temp 98.1°F | Resp 18 | Ht 61.0 in | Wt 157.0 lb

## 2023-11-01 DIAGNOSIS — D72820 Lymphocytosis (symptomatic): Secondary | ICD-10-CM

## 2023-11-01 NOTE — Assessment & Plan Note (Addendum)
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 Results for CCND1/IGH, ATM, chromosome  12, and TP53 were normal. Deletion of the tumor suppressor loci at 13q is a common finding in CLL.  --Flow cytometry showed abnormal B-cell population constituting 75% of lymphocytes (6,000 cells per microliter).  The B  cells are positive for CD5, CD19, CD20, CD38, CD200 and express lambda light chains.  The findings are consistent with chronic lymphocytic leukemia/small lymphocytic lymphoma.  Correlation with potential sites of lymphadenopathy, splenomegaly and cytogenetics may be performed as clinically indicated to rule out atypical mantle cell lymphoma.  -- We discussed that CLL is an indolent condition that could generally take several years before requiring treatment and most times may not even require treatment.   --Indications to treat CLL generally include rapid doubling time, anemia or thrombocytopenia as well as B symptoms or worsening generalized lymphadenopathy. --Labs from 10/13/23 show increase in WBC to 15.1 with ALC is 8.2.  Platelets and hemoglobin are WNL.  No lymphadenopathy on exam today.  Most recent CT scan does not reveal any hepatosplenomegaly or evidence of swollen lymph nodes. --Based on current labs and CT scan, she has Rai stage 0 which does not require treatment and considered low risk.  Overall survival is greater than 10 years. --Plan is to see her back every 3 months to check labs x 1 year and then every 6 months.

## 2023-11-01 NOTE — Progress Notes (Signed)
 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 Results for CCND1/IGH, ATM, chromosome  12, and TP53 were normal. Deletion of the tumor suppressor loci at 13q is a common finding in CLL.  --Flow cytometry showed abnormal B-cell population constituting 75% of lymphocytes (6,000 cells per microliter).  The B  cells are positive for CD5, CD19, CD20, CD38, CD200 and express lambda light chains.  The findings are consistent with chronic lymphocytic leukemia/small lymphocytic lymphoma.  Correlation with potential sites of lymphadenopathy, splenomegaly and cytogenetics may be performed as clinically indicated to rule out atypical mantle cell lymphoma.  -- We discussed that CLL is an indolent condition that could generally take several years before requiring treatment and most times may not even require treatment.   --Indications to treat CLL generally include rapid doubling time, anemia or thrombocytopenia as well as B symptoms or worsening generalized lymphadenopathy. --Labs from 10/13/23 show increase in WBC to 15.1 with ALC is 8.2.  Platelets and hemoglobin are WNL.  No lymphadenopathy on exam today.  Most recent CT scan does not reveal any hepatosplenomegaly or evidence of swollen lymph nodes. --Based on current labs and CT scan, she has Rai stage 0 which does not require treatment and considered low risk.  Overall survival is greater than 10 years. --Plan is to see her back every 3 months to check labs x 1 year and then every 6 months.    Orders Placed This Encounter  Procedures   Comprehensive metabolic panel with GFR    Standing Status:   Future    Expected Date:   02/01/2024    Expiration Date:   05/01/2024   Lactate dehydrogenase    Standing Status:   Future    Expected Date:   02/01/2024    Expiration  Date:   05/01/2024   CBC with Differential/Platelet    Standing Status:   Future    Expected Date:   02/01/2024    Expiration Date:   05/01/2024    INTERVAL HISTORY:  Candice Landry is an 78 year old female who presents with elevated white blood cell count for hematological evaluation. She was referred for evaluation of elevated white blood cell count primarily elevated lymphocytes.   Over the past five years, she has experienced a gradual increase in her white blood cell count, rising from 10.7 to 16.7. The increase is primarily due to elevated lymphocytes, currently at 9.5. Hemoglobin and platelet levels remain within normal limits.  She does not report having any fatigue or weight loss or lymphadenopathy or night sweats.  No recent infections or vaccinations.  She reports feeling overall stable from her previous visit.  Appetite and energy levels are 100%.  Has numbness in her right fingers at times.  We reviewed FISH, Flow cytometry, CBC, CMP.  SUMMARY OF HEMATOLOGIC HISTORY: Oncology History   No history exists.     CBC    Component Value Date/Time   WBC 15.1 (H) 10/13/2023 1132   RBC 4.68 10/13/2023 1132   HGB 14.7 10/13/2023 1132   HCT 44.0 10/13/2023 1132   PLT 228 10/13/2023 1132   MCV 94.0 10/13/2023 1132   MCH 31.4 10/13/2023 1132   MCHC 33.4 10/13/2023 1132   RDW 13.5 10/13/2023 1132   LYMPHSABS 8.2 (H) 10/13/2023  1132   MONOABS 0.5 10/13/2023 1132   EOSABS 0.2 10/13/2023 1132   BASOSABS 0.1 10/13/2023 1132       Latest Ref Rng & Units 10/13/2023   11:32 AM 07/19/2023    1:42 PM 04/20/2021    9:02 AM  CMP  Glucose 70 - 99 mg/dL 899   897   BUN 8 - 23 mg/dL 21   16   Creatinine 9.55 - 1.00 mg/dL 9.01  8.99  9.20   Sodium 135 - 145 mmol/L 142   141   Potassium 3.5 - 5.1 mmol/L 4.1   4.4   Chloride 98 - 111 mmol/L 105   110   CO2 22 - 32 mmol/L 23   26   Calcium 8.9 - 10.3 mg/dL 89.6   9.6   Total Protein 6.5 - 8.1 g/dL 7.5     Total Bilirubin 0.0 - 1.2 mg/dL  0.6     Alkaline Phos 38 - 126 U/L 92     AST 15 - 41 U/L 29     ALT 0 - 44 U/L 38        No results found for: FERRITIN, VITAMINB12  Vitals:   11/01/23 1028 11/01/23 1039  BP: (!) 149/70 139/78  Pulse: 71   Resp: 18   Temp: 98.1 F (36.7 C)   SpO2: 97%     Review of System:  Review of Systems  Neurological:  Positive for sensory change.    Physical Exam: Physical Exam Constitutional:      Appearance: Normal appearance.  HENT:     Head: Normocephalic and atraumatic.  Eyes:     Pupils: Pupils are equal, round, and reactive to light.  Cardiovascular:     Rate and Rhythm: Normal rate and regular rhythm.     Heart sounds: Normal heart sounds. No murmur heard. Pulmonary:     Effort: Pulmonary effort is normal.     Breath sounds: Normal breath sounds. No wheezing.  Abdominal:     General: Bowel sounds are normal. There is no distension.     Palpations: Abdomen is soft.     Tenderness: There is no abdominal tenderness.  Musculoskeletal:        General: Normal range of motion.     Cervical back: Normal range of motion.  Skin:    General: Skin is warm and dry.     Findings: No rash.  Neurological:     Mental Status: She is alert and oriented to person, place, and time.     Gait: Gait is intact.  Psychiatric:        Mood and Affect: Mood and affect normal.        Cognition and Memory: Memory normal.        Judgment: Judgment normal.      I spent 30 minutes dedicated to the care of this patient (face-to-face and non-face-to-face) on the date of the encounter to include what is described in the assessment and plan.,  Delon Hope, NP 11/01/2023 10:52 AM

## 2023-11-02 NOTE — Addendum Note (Signed)
 Addended by: Itai Barbian on: 11/02/2023 09:30 PM   Modules accepted: Level of Service

## 2024-01-10 ENCOUNTER — Other Ambulatory Visit (HOSPITAL_COMMUNITY): Payer: Self-pay | Admitting: Nurse Practitioner

## 2024-01-10 DIAGNOSIS — E041 Nontoxic single thyroid nodule: Secondary | ICD-10-CM

## 2024-01-10 DIAGNOSIS — K7689 Other specified diseases of liver: Secondary | ICD-10-CM

## 2024-01-25 ENCOUNTER — Ambulatory Visit (HOSPITAL_COMMUNITY)
Admission: RE | Admit: 2024-01-25 | Discharge: 2024-01-25 | Disposition: A | Discharge status: Home / Self Care | Source: Ambulatory Visit | Attending: Nurse Practitioner | Admitting: Nurse Practitioner

## 2024-01-25 ENCOUNTER — Ambulatory Visit (HOSPITAL_COMMUNITY)
Admission: RE | Admit: 2024-01-25 | Discharge: 2024-01-25 | Attending: Nurse Practitioner | Admitting: Nurse Practitioner

## 2024-01-25 DIAGNOSIS — E041 Nontoxic single thyroid nodule: Secondary | ICD-10-CM | POA: Diagnosis present

## 2024-01-25 DIAGNOSIS — K7689 Other specified diseases of liver: Secondary | ICD-10-CM | POA: Insufficient documentation

## 2024-01-25 MED ORDER — IOHEXOL 300 MG/ML  SOLN
100.0000 mL | Freq: Once | INTRAMUSCULAR | Status: AC | PRN
  Administered 2024-01-25: 100 mL via INTRAVENOUS

## 2024-02-01 ENCOUNTER — Inpatient Hospital Stay: Payer: Self-pay | Attending: Hematology and Oncology

## 2024-02-01 DIAGNOSIS — D72820 Lymphocytosis (symptomatic): Secondary | ICD-10-CM

## 2024-02-01 LAB — COMPREHENSIVE METABOLIC PANEL WITH GFR
ALT: 28 U/L (ref 0–44)
AST: 26 U/L (ref 15–41)
Albumin: 4.7 g/dL (ref 3.5–5.0)
Alkaline Phosphatase: 73 U/L (ref 38–126)
Anion gap: 16 — ABNORMAL HIGH (ref 5–15)
BUN: 24 mg/dL — ABNORMAL HIGH (ref 8–23)
CO2: 20 mmol/L — ABNORMAL LOW (ref 22–32)
Calcium: 9.8 mg/dL (ref 8.9–10.3)
Chloride: 106 mmol/L (ref 98–111)
Creatinine, Ser: 0.8 mg/dL (ref 0.44–1.00)
GFR, Estimated: >60 mL/min
Glucose, Bld: 98 mg/dL (ref 70–99)
Potassium: 3.7 mmol/L (ref 3.5–5.1)
Sodium: 141 mmol/L (ref 135–145)
Total Bilirubin: 0.8 mg/dL (ref 0.0–1.2)
Total Protein: 7.5 g/dL (ref 6.5–8.1)

## 2024-02-01 LAB — CBC WITH DIFFERENTIAL/PLATELET
Abs Immature Granulocytes: 0.03 K/uL (ref 0.00–0.07)
Basophils Absolute: 0.1 K/uL (ref 0.0–0.1)
Basophils Relative: 1 %
Eosinophils Absolute: 0.2 K/uL (ref 0.0–0.5)
Eosinophils Relative: 1 %
HCT: 43.5 % (ref 36.0–46.0)
Hemoglobin: 14 g/dL (ref 12.0–15.0)
Immature Granulocytes: 0 %
Lymphocytes Relative: 58 %
Lymphs Abs: 9.1 K/uL — ABNORMAL HIGH (ref 0.7–4.0)
MCH: 30.2 pg (ref 26.0–34.0)
MCHC: 32.2 g/dL (ref 30.0–36.0)
MCV: 93.8 fL (ref 80.0–100.0)
Monocytes Absolute: 0.5 K/uL (ref 0.1–1.0)
Monocytes Relative: 4 %
Neutro Abs: 5.6 K/uL (ref 1.7–7.7)
Neutrophils Relative %: 36 %
Platelets: 239 K/uL (ref 150–400)
RBC: 4.64 MIL/uL (ref 3.87–5.11)
RDW: 13.7 % (ref 11.5–15.5)
Smear Review: NORMAL
WBC: 15.4 K/uL — ABNORMAL HIGH (ref 4.0–10.5)
nRBC: 0 % (ref 0.0–0.2)

## 2024-02-01 LAB — LACTATE DEHYDROGENASE: LDH: 198 U/L (ref 105–235)

## 2024-02-08 ENCOUNTER — Inpatient Hospital Stay: Payer: Self-pay | Admitting: Oncology

## 2024-02-08 VITALS — BP 145/70 | HR 81 | Temp 98.9°F | Resp 18 | Ht 62.5 in | Wt 157.0 lb

## 2024-02-08 DIAGNOSIS — E041 Nontoxic single thyroid nodule: Secondary | ICD-10-CM

## 2024-02-08 DIAGNOSIS — D72820 Lymphocytosis (symptomatic): Secondary | ICD-10-CM

## 2024-02-08 NOTE — Assessment & Plan Note (Signed)
 Had thyroid  nodule

## 2024-02-08 NOTE — Progress Notes (Signed)
 "  [facility] OFFICE PROGRESS NOTE  Shona Norleen PEDLAR, MD  ASSESSMENT & PLAN:    Assessment & Plan Lymphocytosis -- ALC over the past 3 years  has ranged from 4.2-9.5 -- FISH 67% of Nuclei positive for homozygous 13 Q Deletion. Results for CCND1/IGH, ATM, chromosome  12, and TP53 were normal. Deletion of the tumor suppressor loci at 13q is a common finding in CLL.  --Flow cytometry showed abnormal B-cell population constituting 75% of lymphocytes (6,000 cells per microliter).  The B  cells are positive for CD5, CD19, CD20, CD38, CD200 and express lambda light chains.  The findings are consistent with chronic lymphocytic leukemia/small lymphocytic lymphoma.  Correlation with potential sites of lymphadenopathy, splenomegaly and cytogenetics may be performed as clinically indicated to rule out atypical mantle cell lymphoma.  -- We discussed that CLL is an indolent condition that could generally take several years before requiring treatment and most times may not even require treatment.   --Indications to treat CLL generally include rapid doubling time, anemia or thrombocytopenia as well as B symptoms or worsening generalized lymphadenopathy. --Labs from 02/01/2024 shows white blood cell count 15.4 (15.1) and absolute lymphocyte count of 9.1 (8.2).  Hemoglobin and platelet counts are WNL. -No lymphadenopathy on exam today.  Most recent CT scan does not reveal any hepatosplenomegaly or evidence of swollen lymph nodes. --Based on current labs and CT scan, she has Rai stage 0 which does not require treatment and considered low risk.  Overall survival is greater than 10 years. --Plan is to see her back every 3 months to check labs x 1 year and then every 6 months.   Thyroid  nodule She recently had a thyroid  ultrasound for a enlarging thyroid  nodule over the past couple of years and it was recommended she have a fine-needle aspiration.  There was a 1.2 cm left inferior TR 5 thyroid  nodule. She  has not scheduled this yet.  Orders Placed This Encounter  Procedures   Comprehensive metabolic panel with GFR    Standing Status:   Future    Expected Date:   05/10/2024    Expiration Date:   08/08/2024   Lactate dehydrogenase    Standing Status:   Future    Expected Date:   05/10/2024    Expiration Date:   08/08/2024   CBC with Differential/Platelet    Standing Status:   Future    Expected Date:   05/10/2024    Expiration Date:   08/08/2024    INTERVAL HISTORY:  Candice Landry is an 79 year old female who presents with elevated white blood cell count for hematological evaluation. She was referred for evaluation of elevated white blood cell count primarily elevated lymphocytes.  She presents today with her grandson.   Over the past five years, she has experienced a gradual increase in her white blood cell count, rising from 10.7 to 16.7. The increase is primarily due to elevated lymphocytes, currently at 9.5. Hemoglobin and platelet levels remain within normal limits.  She does not report having any fatigue or weight loss or lymphadenopathy or night sweats.  No recent infections or vaccinations.  She reports feeling overall stable from her previous visit.  Appetite and energy levels are 100%.  Has numbness in her right fingers at times.  Reports recently having both a thyroid  ultrasound and CT abdomen for follow-up on liver lesion and thyroid  nodules.  Thyroid  ultrasound showed a 1.2 cm left inferior TR 5 thyroid  nodule.  A  fine-needle aspiration biopsy was recommended but she has not scheduled this yet.  CT of her abdomen showed stable small chronic cyst in the liver and bilateral Bosniak 1 and 2 renal cysts.  Cardiomegaly aortic and branch vessel arthrosclerosis and small hiatal hernia.  Patient to call and have biopsy schedule of her thyroid  nodule in the near future.  We reviewed LDH, CBC and CMP.  SUMMARY OF HEMATOLOGIC HISTORY: Oncology History   No problem history exists.     CBC     Component Value Date/Time   WBC 15.4 (H) 02/01/2024 1026   RBC 4.64 02/01/2024 1026   HGB 14.0 02/01/2024 1026   HCT 43.5 02/01/2024 1026   PLT 239 02/01/2024 1026   MCV 93.8 02/01/2024 1026   MCH 30.2 02/01/2024 1026   MCHC 32.2 02/01/2024 1026   RDW 13.7 02/01/2024 1026   LYMPHSABS 9.1 (H) 02/01/2024 1026   MONOABS 0.5 02/01/2024 1026   EOSABS 0.2 02/01/2024 1026   BASOSABS 0.1 02/01/2024 1026       Latest Ref Rng & Units 02/01/2024   10:26 AM 10/13/2023   11:32 AM 07/19/2023    1:42 PM  CMP  Glucose 70 - 99 mg/dL 98  899    BUN 8 - 23 mg/dL 24  21    Creatinine 9.55 - 1.00 mg/dL 9.19  9.01  8.99   Sodium 135 - 145 mmol/L 141  142    Potassium 3.5 - 5.1 mmol/L 3.7  4.1    Chloride 98 - 111 mmol/L 106  105    CO2 22 - 32 mmol/L 20  23    Calcium 8.9 - 10.3 mg/dL 9.8  89.6    Total Protein 6.5 - 8.1 g/dL 7.5  7.5    Total Bilirubin 0.0 - 1.2 mg/dL 0.8  0.6    Alkaline Phos 38 - 126 U/L 73  92    AST 15 - 41 U/L 26  29    ALT 0 - 44 U/L 28  38       No results found for: FERRITIN, VITAMINB12  Vitals:   02/08/24 1115  BP: (!) 148/78  Pulse: 81  Resp: 18  Temp: 98.9 F (37.2 C)  SpO2: 98%     Review of System:  Review of Systems  Neurological:  Positive for sensory change.    Physical Exam: Physical Exam Constitutional:      Appearance: Normal appearance.  HENT:     Head: Normocephalic and atraumatic.  Eyes:     Pupils: Pupils are equal, round, and reactive to light.  Cardiovascular:     Rate and Rhythm: Normal rate and regular rhythm.     Heart sounds: Normal heart sounds. No murmur heard. Pulmonary:     Effort: Pulmonary effort is normal.     Breath sounds: Normal breath sounds. No wheezing.  Abdominal:     General: Bowel sounds are normal. There is no distension.     Palpations: Abdomen is soft.     Tenderness: There is no abdominal tenderness.  Musculoskeletal:        General: Normal range of motion.     Cervical back: Normal range of  motion.  Skin:    General: Skin is warm and dry.     Findings: No rash.  Neurological:     Mental Status: She is alert and oriented to person, place, and time.     Gait: Gait is intact.  Psychiatric:  Mood and Affect: Mood and affect normal.        Cognition and Memory: Memory normal.        Judgment: Judgment normal.      I spent 30 minutes dedicated to the care of this patient (face-to-face and non-face-to-face) on the date of the encounter to include what is described in the assessment and plan.,  Delon Hope, NP 02/08/2024 11:17 AM "

## 2024-02-08 NOTE — Assessment & Plan Note (Signed)
 She recently had a thyroid  ultrasound for a enlarging thyroid  nodule over the past couple of years and it was recommended she have a fine-needle aspiration.  There was a 1.2 cm left inferior TR 5 thyroid  nodule. She has not scheduled this yet.

## 2024-02-08 NOTE — Assessment & Plan Note (Addendum)
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 Results for CCND1/IGH, ATM, chromosome  12, and TP53 were normal. Deletion of the tumor suppressor loci at 13q is a common finding in CLL.  --Flow cytometry showed abnormal B-cell population constituting 75% of lymphocytes (6,000 cells per microliter).  The B  cells are positive for CD5, CD19, CD20, CD38, CD200 and express lambda light chains.  The findings are consistent with chronic lymphocytic leukemia/small lymphocytic lymphoma.  Correlation with potential sites of lymphadenopathy, splenomegaly and cytogenetics may be performed as clinically indicated to rule out atypical mantle cell lymphoma.  -- We discussed that CLL is an indolent condition that could generally take several years before requiring treatment and most times may not even require treatment.   --Indications to treat CLL generally include rapid doubling time, anemia or thrombocytopenia as well as B symptoms or worsening generalized lymphadenopathy. --Labs from 02/01/2024 shows white blood cell count 15.4 (15.1) and absolute lymphocyte count of 9.1 (8.2).  Hemoglobin and platelet counts are WNL. -No lymphadenopathy on exam today.  Most recent CT scan does not reveal any hepatosplenomegaly or evidence of swollen lymph nodes. --Based on current labs and CT scan, she has Rai stage 0 which does not require treatment and considered low risk.  Overall survival is greater than 10 years. --Plan is to see her back every 3 months to check labs x 1 year and then every 6 months.

## 2024-05-09 ENCOUNTER — Inpatient Hospital Stay

## 2024-05-16 ENCOUNTER — Inpatient Hospital Stay: Admitting: Oncology
